# Patient Record
Sex: Female | Born: 1990 | Race: White | Hispanic: No | Marital: Married
Health system: Southern US, Community
[De-identification: ages and names within clinical notes are randomized; demographics above are authoritative.]

## PROBLEM LIST (undated history)

## (undated) DIAGNOSIS — R062 Wheezing: Secondary | ICD-10-CM

## (undated) DIAGNOSIS — L0291 Cutaneous abscess, unspecified: Secondary | ICD-10-CM

## (undated) DIAGNOSIS — K589 Irritable bowel syndrome without diarrhea: Secondary | ICD-10-CM

## (undated) DIAGNOSIS — R509 Fever, unspecified: Secondary | ICD-10-CM

## (undated) DIAGNOSIS — R059 Cough, unspecified: Secondary | ICD-10-CM

## (undated) DIAGNOSIS — R05 Cough: Secondary | ICD-10-CM

## (undated) DIAGNOSIS — M0800 Unspecified juvenile rheumatoid arthritis of unspecified site: Secondary | ICD-10-CM

## (undated) DIAGNOSIS — Z8614 Personal history of Methicillin resistant Staphylococcus aureus infection: Secondary | ICD-10-CM

## (undated) DIAGNOSIS — R140 Abdominal distension (gaseous): Secondary | ICD-10-CM

## (undated) DIAGNOSIS — G43909 Migraine, unspecified, not intractable, without status migrainosus: Secondary | ICD-10-CM

## (undated) DIAGNOSIS — K219 Gastro-esophageal reflux disease without esophagitis: Secondary | ICD-10-CM

## (undated) DIAGNOSIS — IMO0002 Reserved for concepts with insufficient information to code with codable children: Secondary | ICD-10-CM

## (undated) DIAGNOSIS — R5383 Other fatigue: Secondary | ICD-10-CM

## (undated) DIAGNOSIS — M329 Systemic lupus erythematosus, unspecified: Secondary | ICD-10-CM

## (undated) DIAGNOSIS — R197 Diarrhea, unspecified: Secondary | ICD-10-CM

## (undated) DIAGNOSIS — R634 Abnormal weight loss: Secondary | ICD-10-CM

## (undated) DIAGNOSIS — R531 Weakness: Secondary | ICD-10-CM

## (undated) DIAGNOSIS — J329 Chronic sinusitis, unspecified: Secondary | ICD-10-CM

## (undated) DIAGNOSIS — M797 Fibromyalgia: Secondary | ICD-10-CM

## (undated) DIAGNOSIS — F4541 Pain disorder exclusively related to psychological factors: Secondary | ICD-10-CM

## (undated) DIAGNOSIS — Z8669 Personal history of other diseases of the nervous system and sense organs: Secondary | ICD-10-CM

## (undated) HISTORY — DX: Migraine, unspecified, not intractable, without status migrainosus: G43.909

## (undated) HISTORY — DX: Diarrhea, unspecified: R19.7

## (undated) HISTORY — DX: Other fatigue: R53.83

## (undated) HISTORY — DX: Pain disorder exclusively related to psychological factors: F45.41

## (undated) HISTORY — PX: TONSILLECTOMY AND ADENOIDECTOMY: SHX28

## (undated) HISTORY — PX: TONSILLECTOMY: SUR1361

## (undated) HISTORY — DX: Unspecified juvenile rheumatoid arthritis of unspecified site: M08.00

## (undated) HISTORY — DX: Cough: R05

## (undated) HISTORY — PX: BREAST REDUCTION SURGERY: SHX8

## (undated) HISTORY — DX: Abnormal weight loss: R63.4

## (undated) HISTORY — DX: Weakness: R53.1

## (undated) HISTORY — DX: Wheezing: R06.2

## (undated) HISTORY — DX: Cutaneous abscess, unspecified: L02.91

## (undated) HISTORY — DX: Personal history of other diseases of the nervous system and sense organs: Z86.69

## (undated) HISTORY — DX: Irritable bowel syndrome, unspecified: K58.9

## (undated) HISTORY — DX: Chronic sinusitis, unspecified: J32.9

## (undated) HISTORY — DX: Fever, unspecified: R50.9

## (undated) HISTORY — PX: SMALL INTESTINE SURGERY: SHX150

## (undated) HISTORY — DX: Gastro-esophageal reflux disease without esophagitis: K21.9

## (undated) HISTORY — DX: Cough, unspecified: R05.9

## (undated) HISTORY — DX: Personal history of Methicillin resistant Staphylococcus aureus infection: Z86.14

## (undated) HISTORY — DX: Abdominal distension (gaseous): R14.0

## (undated) HISTORY — PX: HIP SURGERY: SHX245

## (undated) HISTORY — DX: Systemic lupus erythematosus, unspecified: M32.9

## (undated) HISTORY — DX: Fibromyalgia: M79.7

---
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
## 2010-05-10 ENCOUNTER — Ambulatory Visit (HOSPITAL_COMMUNITY): Admission: RE | Admit: 2010-05-10 | Discharge: 2010-05-10 | Payer: Self-pay | Admitting: Rheumatology

## 2010-06-27 ENCOUNTER — Encounter: Payer: Self-pay | Admitting: Emergency Medicine

## 2010-07-17 ENCOUNTER — Encounter: Payer: Self-pay | Admitting: Emergency Medicine

## 2010-07-24 ENCOUNTER — Encounter: Payer: Self-pay | Admitting: Emergency Medicine

## 2010-08-16 ENCOUNTER — Encounter: Payer: Self-pay | Admitting: Emergency Medicine

## 2010-08-21 ENCOUNTER — Encounter: Payer: Self-pay | Admitting: Emergency Medicine

## 2010-09-03 DIAGNOSIS — M329 Systemic lupus erythematosus, unspecified: Secondary | ICD-10-CM | POA: Insufficient documentation

## 2010-09-03 DIAGNOSIS — M083 Juvenile rheumatoid polyarthritis (seronegative): Secondary | ICD-10-CM | POA: Insufficient documentation

## 2010-09-03 DIAGNOSIS — IMO0001 Reserved for inherently not codable concepts without codable children: Secondary | ICD-10-CM | POA: Insufficient documentation

## 2010-09-04 ENCOUNTER — Ambulatory Visit: Payer: Self-pay | Admitting: Emergency Medicine

## 2010-09-04 DIAGNOSIS — R0602 Shortness of breath: Secondary | ICD-10-CM | POA: Insufficient documentation

## 2010-09-10 ENCOUNTER — Ambulatory Visit: Payer: Self-pay | Admitting: Emergency Medicine

## 2011-01-07 NOTE — Letter (Signed)
Summary: Pollyann Savoy MD  Pollyann Savoy MD   Imported By: [facility] 10/17/2010 08:22:20  _____________________________________________________________________  External Attachment:    Type:   Image     Comment:   External Document

## 2011-01-07 NOTE — Assessment & Plan Note (Signed)
Summary: dyspnea, SLE/RA   Visit Type:  Follow-up Copy to:  Dr. Corliss Skains Primary Provider/Referring Provider:  Dr.  Camillo Flaming  CC:  Dyspnea follow-up with PFT's....  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 Has been on MTX in past, also embrel, humera, orencia.   Referred by Dr Titus Dubin 09/04/10. Was treated for first time in July '11 with golimumab and at that time developed symptoms consistent with SLE. In retrospect she had a similar episode in the past with another antibody therapy. She had pred increased, added low dose plaquanil and steroid cream for her skin, rash improved but weakness, fatigue have worsened. Also developed dyspnea, feels like she can't take a deep breath, no wheeze, minimal cough, no sputum. 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  ROV 09/10/10 -- [place] returns today after CXR and PFT's performed for her progressive dyspnea. CXR without infiltrates as below.  She is clinically unchanged.   Current Medications (verified): 1)  Protonix 40 Mg Tbec (Pantoprazole Sodium) .... Take 1 Tablet By Mouth Once A Day 2)  Prednisone 5 Mg Tabs (Prednisone) .... Pt Currently On 15mg  Daily, Working Way Down To 10mg  3)  Arava 20 Mg Tabs (Leflunomide) .... Take 1 Tablet By Mouth Once A Day 4)  Loestrin 24 Fe 1-20 Mg-Mcg Tabs (Norethin Ace-Eth Estrad-Fe) .... Take 1 Tablet By Mouth Once A Day 5)  Vesicare 5 Mg Tabs (Solifenacin Succinate) .... Take 1 Tablet By Mouth Once A Day 6)  Atenolol 50 Mg Tabs (Atenolol) .... Take 1 Tablet By Mouth Once A Day 7)  Plaquenil 200 Mg Tabs (Hydroxychloroquine Sulfate) .Marland Kitchen.. 1 By Mouth Every Morning and 1/2 By Mouth Every Night 8)  Wellbutrin 75 Mg Tabs (Bupropion Hcl) .... Take 1 Tablet By Mouth Once A Day 9)  Steroid Cream .... Daily 10)  Ambien 10 Mg Tabs (Zolpidem Tartrate) .Marland Kitchen.. 1 By Mouth At Bedtime As Needed  Allergies (verified): No  Known Drug Allergies  Vital Signs:  Patient profile:   20 year old female Height:      62 inches Weight:      113 pounds O2 Sat:      97 % on Room air Temp:     98.4 degrees F oral Pulse rate:   99 / minute BP sitting:   118 / 80  (left arm) Cuff size:   regular  Vitals Entered By: Michel Bickers CMA (September 10, 2010 11:51 AM)  O2 Sat at Rest %:  97 O2 Flow:  Room air CC: Dyspnea follow-up with PFT's...   Physical Exam  General:  normal appearance and healthy appearing.   Head:  normocephalic and atraumatic Eyes:  conjunctiva and sclera clear Nose:  no deformity, discharge, inflammation, or lesions Mouth:  mild post pharyngeal erythema, no lesions Neck:  no masses, thyromegaly, or abnormal cervical nodes Lungs:  clear bilaterally to auscultation Heart:  Tachycardic, S3 audible at L heart border, S4 audible in sternal area, no M Abdomen:  not examined Msk:  normal Extremities:  no clubbing, cyanosis, edema, or deformity noted Neurologic:  non-focal Skin:  bruises on both knees, no malar rash (resolved) Psych:  alert and cooperative; normal mood and affect; normal attention span and concentration   X-ray  Procedure date:  09/04/2010  Findings:      Comparison: PA and lateral chest 05/10/2010.   Findings: Lungs clear.  Heart size normal.  No pleural effusion or focal bony abnormality.   IMPRESSION: Negative chest.   Read By:  Charyl Dancer,  M.D.     Released By:  Charyl Dancer,  M.D.  Pulmonary Function Test Date: 09/10/2010 Height (in.): 62 Gender: Female  Pre-Spirometry FVC    Value: 3.25 L/min   Pred: 3.19 L/min     % Pred: 102 % FEV1    Value: 2.99 L     Pred: 3.03 L     % Pred: 99 % FEV1/FVC  Value: 92 %     Pred: 86 %     % Pred: - % FEF 25-75  Value: 3.59 L/min   Pred: 3.42 L/min     % Pred: 105 %  Post-Spirometry FVC    Value: 3.19 L/min   Pred: 3.19 L/min     % Pred: 100 % FEV1    Value: 3.08 L     Pred: 3.03 L     % Pred: 99  % FEV1/FVC  Value: 97 %     Pred: 86 %     % Pred: - % FEF 25-75  Value: 4.21 L/min   Pred: 3.42 L/min     % Pred: 123 %  Lung Volumes TLC    Value: 4.49 L   % Pred: 108 % RV    Value: 1.24 L   % Pred: 141 % DLCO    Value: 19.2 %   % Pred: 89 %  Comments: Normal spirometry. ? suggestion of restriction based on high FEV1/FVC ration but this is not supported by lung volumes. Normal diffusion capacity. RSB   Impression & Recommendations:  Problem # 1:  DYSPNEA (ICD-786.05)  PFT and CXR are normal. No evidence to support PAH. I  believe that this is related to her overall weakness and fatigue. Unfortunately it appears that we will have to either d/c plaquanil or increase plaquanil to see if this is drug side effect vs weakness from lupus. She will discuss this with Dr Titus Dubin. If she continues to have problems and if th edx of SLE is confirmed then I will revisit PAH, possible TTE or R heart cath.   Orders: Est. Patient Level IV (72536)  Medications Added to Medication List This Visit: 1)  Plaquenil 200 Mg Tabs (Hydroxychloroquine sulfate) .Marland Kitchen.. 1 by mouth every morning and 1/2 by mouth every night 2)  Ambien 10 Mg Tabs (Zolpidem tartrate) .Marland Kitchen.. 1 by mouth at bedtime as needed  Patient Instructions: 1)  Your PFTs and CXR are normal.  2)  Please follow up with Dr Delton Coombes if your develop any worsening symptoms

## 2011-01-07 NOTE — Miscellaneous (Signed)
Summary: Orders Update pft charges  Clinical Lists Changes  Orders: Added new Service order of Carbon Monoxide diffusing w/capacity (94720) - Signed Added new Service order of Lung Volumes (94240) - Signed Added new Service order of Spirometry (Pre & Post) (94060) - Signed 

## 2011-01-07 NOTE — Letter (Signed)
Summary: Pollyann Savoy MD  Pollyann Savoy MD   Imported By: [facility] 10/17/2010 08:25:21  _____________________________________________________________________  External Attachment:    Type:   Image     Comment:   External Document

## 2011-01-07 NOTE — Assessment & Plan Note (Signed)
Summary: dyspnea, RA, SLE   Visit Type:  Initial Consult Copy to:  Dr. Corliss Skains Primary Provider/Referring Provider:  Dr.  Camillo Flaming  CC:  Pulmonary consult for SOB x 3 months started after a shot of Sympany. Pt has not taken shot since then.  SOB went away and but then came back once started Plaquenil. 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 Has been on MTX in past, also embrel, humera, orencia.   Referred by Dr Titus Dubin. Was treated for first time in July '11 with golimumab and at that time developed symptoms consistent with SLE. In retrospect she had a similar episode in the past with antibody therapy. She had pred increased, added low dose plaquanil, rash improved but weakness, fatigue have worsened. Also developed dyspnea, feels like she can't take a deep breath, no wheeze, minimal cough, no sputum. 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   Current Medications (verified): 1)  Protonix 40 Mg Tbec (Pantoprazole Sodium) .... Take 1 Tablet By Mouth Once A Day 2)  Prednisone 5 Mg Tabs (Prednisone) .... Pt Currently On 15mg  Daily, Working Way Down To 10mg  3)  Arava 20 Mg Tabs (Leflunomide) .... Take 1 Tablet By Mouth Once A Day 4)  Loestrin 24 Fe 1-20 Mg-Mcg Tabs (Norethin Ace-Eth Estrad-Fe) .... Take 1 Tablet By Mouth Once A Day 5)  Vesicare 5 Mg Tabs (Solifenacin Succinate) .... Take 1 Tablet By Mouth Once A Day 6)  Atenolol 50 Mg Tabs (Atenolol) .... Take 1 Tablet By Mouth Once A Day 7)  Plaquenil 200 Mg Tabs (Hydroxychloroquine Sulfate) .... Take 1 Tablet By Mouth Once A Day 8)  Wellbutrin 75 Mg Tabs (Bupropion Hcl) .... Take 1 Tablet By Mouth Once A Day 9)  Steroid Cream .... Daily  Allergies (verified): No Known Drug Allergies  Past History:  Past Medical History: Lupus, new diagnosis - drug-induced vs inherent Fibromyalgia Rheumatoid arthritis,  juvenile  Family History: Mother-asthma  Social History: Single Never smoker No ETOH Pt is a Consulting civil engineer at [employer]  Review of Systems       The patient complains of shortness of breath with activity, shortness of breath at rest, non-productive cough, acid heartburn, indigestion, difficulty swallowing, headaches, itching, hand/feet swelling, joint stiffness or pain, and rash.  The patient denies productive cough, coughing up blood, chest pain, irregular heartbeats, loss of appetite, weight change, abdominal pain, sore throat, tooth/dental problems, nasal congestion/difficulty breathing through nose, sneezing, ear ache, anxiety, depression, change in color of mucus, and fever.         Cough in the evening, non-productive. Allergies under good control.   Vital Signs:  Patient profile:   20 year old female Height:      61.5 inches Weight:      115.13 pounds BMI:     21.48 O2 Sat:      95 % on Room air Temp:     98.9 degrees F oral Pulse rate:   97 / minute BP sitting:   122 / 86  (right arm) Cuff size:   regular  Vitals Entered By: Carron Curie CMA (September 04, 2010 4:01 PM)  O2 Flow:  Room air CC: Pulmonary consult for SOB x 3 months started after a shot of Sympany. Pt has not taken shot since then.  SOB went away, but then came back once started Plaquenil.  Comments Medications reviewed with  patient Carron Curie CMA  September 04, 2010 4:12 PM Daytime phone number verified with patient.    Physical Exam  General:  normal appearance and healthy appearing.   Head:  normocephalic and atraumatic Eyes:  conjunctiva and sclera clear Nose:  no deformity, discharge, inflammation, or lesions Mouth:  mild post pharyngeal erythema, no lesions Neck:  no masses, thyromegaly, or abnormal cervical nodes Lungs:  clear bilaterally to auscultation Heart:  Tachycardic, S3 audible at L heart border, S4 audible in sternal area, no M Abdomen:  not examined Msk:   normal Extremities:  no clubbing, cyanosis, edema, or deformity noted Neurologic:  non-focal Skin:  bruises on both knees, no malar rash (resolved) Psych:  alert and cooperative; normal mood and affect; normal attention span and concentration   Impression & Recommendations:  Problem # 1:  DYSPNEA (ICD-786.05) Suspect that this is related to her overall fatigue and weakness that she has been experiencing since biologic therapy in July, then worse with addition of plaquanil for SLE. Agree that we need to rule out intrinsic pulmonary disease that would relate to RA, SLE, MTX-use, etc.  - CXR now and consider CT scan of the chest if any abnormality - full PFTs - rov to review after the above.   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 Pt currently on 15mg  daily, working way down to 10mg     Arava 20 Mg Tabs (Leflunomide) .Marland Kitchen... Take 1 tablet by mouth once a day    Plaquenil 200 Mg Tabs (Hydroxychloroquine sulfate) .Marland Kitchen... Take 1 tablet by mouth once a day  Medications Added to Medication List This Visit: 1)  Protonix 40 Mg Tbec (Pantoprazole sodium) .... Take 1 tablet by mouth once a day 2)  Prednisone 5 Mg Tabs (Prednisone) .... Pt currently on 15mg  daily, working way down to 10mg  3)  Arava 20 Mg Tabs (Leflunomide) .... Take 1 tablet by mouth once a day 4)  Loestrin 24 Fe 1-20 Mg-mcg Tabs (Norethin ace-eth estrad-fe) .... Take 1 tablet by mouth once a day 5)  Vesicare 5 Mg Tabs (Solifenacin succinate) .... Take 1 tablet by mouth once a day 6)  Atenolol 50 Mg Tabs (Atenolol) .... Take 1 tablet by mouth once a day 7)  Plaquenil 200 Mg Tabs (Hydroxychloroquine sulfate) .... Take 1 tablet by mouth once a day 8)  Wellbutrin 75 Mg Tabs (Bupropion hcl) .... Take 1 tablet by mouth once a day 9)  Steroid Cream  .... Daily  Other Orders: Consultation  Level IV (78295) T-2 View CXR (71020TC)  Patient Instructions: 1)  We will check full pulmonary function testing on Oct 4 at 11:00 at [facility].  2)  CXR today 3)  Follow up with Dr Delton Coombes on Oct 4 at 12:00pm   Immunization History:  Influenza Immunization History:    Influenza:  fluvax 3+ (08/08/2009)  Pneumovax Immunization History:    Pneumovax:  pneumovax (08/08/2008)

## 2011-04-16 ENCOUNTER — Other Ambulatory Visit (HOSPITAL_COMMUNITY): Payer: Self-pay | Admitting: Internal Medicine

## 2011-04-16 DIAGNOSIS — R51 Headache: Secondary | ICD-10-CM

## 2011-04-17 ENCOUNTER — Ambulatory Visit (HOSPITAL_COMMUNITY)
Admission: RE | Admit: 2011-04-17 | Discharge: 2011-04-17 | Disposition: A | Discharge status: Home / Self Care | Payer: 59 | Source: Ambulatory Visit | Attending: Internal Medicine | Admitting: Internal Medicine

## 2011-04-17 DIAGNOSIS — R51 Headache: Secondary | ICD-10-CM | POA: Insufficient documentation

## 2011-04-17 MED ORDER — GADOBENATE DIMEGLUMINE 529 MG/ML IV SOLN
10.0000 mL | Freq: Once | INTRAVENOUS | Status: AC
  Administered 2011-04-17: 10 mL via INTRAVENOUS

## 2011-04-22 NOTE — H&P (Signed)
Emily Reed, Emily Reed               ACCOUNT NO.:  [id number]   MEDICAL RECORD NO.:  [id number]          PATIENT TYPE:  INP   LOCATION:  0104                          FACILITY:  [facility]   PHYSICIAN:  Emily Reed, MDDATE OF BIRTH:  1991-05-27   DATE OF ADMISSION:  11/13/2007  DATE OF DISCHARGE:                       PSYCHIATRIC ADMISSION ASSESSMENT   IDENTIFICATION:  This 67-1/20-year-old female is admitted emergently  voluntarily on referral from Emily Reed who has provided therapy since  October 2008 for inpatient stabilization and treatment of suicide risk  and depression.  Patient talks to mother about depression and anxiety  but refuses to talk to family about self-mutilation and suicidal  ideation.  She has suicidal ideation to overdose including in the  bathtub with sleeping pills or to cut her wrists to die.   HISTORY OF PRESENT ILLNESS:  The patient has been in therapy with Emily Reed at [phone], finding that cutting that had been resolved for a  year has now resumed.  She had been cutting otherwise for 3 years for  dissipation of strong negative emotions and currently has healing  superficial lacerations on the left wrist with scars from previous  cutting elsewhere.  The patient will not talk or collaborate with family  to establish safety and improved management of dangerous symptoms.  With  the mother out of the room, she discusses visual illusions of seeing  people figures as well as hearing noises that appear to be more anxious  and associated with re-emergence of repressed and suppressed strong  negative emotions.  The patient is not yet successfully transforming her  ability to cope with chronic medical illness and life changes.  She  still mourns the move from [place] to [place], finishing the  10th grade at [name], having been prior to that at [employer] in [place].  She had to move because of father's job,  leaving older brother and  sister in college in [place].  Approximately  1 year ago, the patient was evaluated and treated at [facility] for medical symptoms included to be juvenile  rheumatoid arthritis and in addition irritable bowel syndrome and  eosinophilic esophagitis.  The patient requires methotrexate  intramuscular weekly and Humira intramuscular every [redacted] weeks along with  her Celebrex.  She has taken Elavil for sleep in the past and is now  taking melatonin.  The patient reports being depressed since the 7th  grade, worse since moving to [place] from [place] last year.  She has been cutting for 3 years, though she stopped for a year until  resuming more recently.  The patient describes significant anxiety and  depression.  She has gained 20 pounds in the last year, finding herself  eating to relieve stress when not cutting.  She has perfectionistic  features, fidgeting, and retentiveness that appear to be associated with  anxiety as well.  She has had difficulty with sleep, has had increased  eating associated with depression as well.  She has been sleeping 12  hours nightly the last week, apparently using her sleeping  pills.  She  stays in her pajamas much of the time and notes she has been more  anxious the last week.  The patient is not stabilizing the symptoms with  support of family or outpatient providers.  The JRA apparently limits  her physical activity which may in the end make coping with stress and  maintaining positive mood more difficult.  She has used no alcohol or  illicit drugs.   PAST MEDICAL HISTORY:  The patient reports receiving primary care from  Dr. Chales Reed as well as Dr. Alphonzo Reed at [facility].  She was seen  in the emergency department in May 2008 with constipation and abdominal  pain.  She had a negative nuclear medicine bone scan in December 2007 at  [facility] noting increased activity around the joints  considered at that  time most consistent with skeletal growth.  All other  testing and treatment have apparently been done at [facility].  The patient  did have tonsillectomy and adenoidectomy.  She has eyeglasses.  She  abstains from ingesting pork.  She abstains from ibuprofen because of  Celebrex.  Her last gynecological exam was in October 2008 and she is on  Loestrin every morning for regulation of her cycle, with last menses  November 09, 2007.  Her last medical appointment was November 2008.   She takes Protonix 40 mg every morning, Xyzal 5 mg every bedtime,  Celebrex 200 mg b.i.d., melatonin 3 mg at bedtime, ALLERGY SHOTS,  methotrexate 25 mg intramuscular every week, Humira intramuscular every  2 weeks and Tylenol Arthritis.  SHE REPORTS ALLERGY TO SESAME.  She  abstains from ibuprofen because of Celebrex.  She denies seizure or  syncope.  She has no heart murmur or arrhythmia.   REVIEW OF SYSTEMS:  The patient denies difficulty with gait, gaze or  continence.  She denies exposure to communicable disease or toxins.  She  denies rash, jaundice, or purpura.  She has no headache or memory loss  currently.  There is no sensory loss or coordination deficit.  There is  no cough, congestion, dyspnea, tachypnea or wheeze.  There is no  abdominal pain, nausea, vomiting or diarrhea.  There is no dysuria, but  she does have arthralgia and limits activity.  There is no chest pain,  palpitations or presyncope.   FAMILY HISTORY:  The patient lived in [place] for 10 years before  moving to [place] last year for father's employment.  Two older  siblings remain in [place] at college.  The patient lives with mother  and father.  They do not acknowledge any pertinent family history for  major psychiatric disorders at this time but family history remains to  be fully developed as the patient tolerates such.   SOCIAL AND DEVELOPMENTAL HISTORY:  The patient is an 11th grade student  at [name],  noting that grades have been diminished  recently.  She reports being at [employer] in the 10th grade  but 9th grade was at [employer] in [place].  The patient does  not acknowledge sexual activity.  She does not use alcohol or illicit  drugs.  She has no legal charges.   ASSETS:  The patient is cooperative and respectful except for her  treatment of herself.   MENTAL STATUS EXAM:  Height is 155 cm.  Weight is 58 kg up from 52.7 kg  in May 2008.  Blood pressure 117/86 with heart rate of 88 sitting  and  124/92 with heart rate of 100 standing.  She is right-handed.  She is  alert and oriented with speech intact.  Cranial nerves II-XII are  intact.  Muscle strength and tone are normal.  There are no pathologic  reflexes or soft neurologic findings.  There are no abnormal involuntary  movements.  Gait and gaze are intact.  She is relatively short in  stature.  She has alexithymic interpersonal posture, storing up and  repressing strong negative emotion including despair, anger and anxiety.  She has spontaneous re-emergence of cumulative anxiety and despair that  contribute to self-injury, though anger is not dissipated externally  except for how it is channeled into self-cutting with anxiety and  depression.  The patient is now suicidal with thoughts of overdosing,  especially with sleeping pills in the bathtub or cutting her wrists to  die.  The patient does not open up with family about her symptoms.  She  is the youngest child with hysteroid displacement and denial.  She does  not manifest overt psychosis.  Her misperceptions seem to be associated  with displacement and relative dissociation in attempting to cope with  chronic medical illness and its consequences as well as anxiety and  despair;  she hears noises and sees people figures.  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  Axis II:  Diagnosis deferred.  Axis III:  Juvenile rheumatoid arthritis; lacerations, left wrist;  relative short stature; irritable bowel syndrome; eosinophilic  esophagitis; birth control pill for irregular menses; eyeglasses;  ALLERGY TO SESAME and abstains from pork and ibuprofen.  Axis IV:  Stressors,  medical severe, acute and chronic; phase of life  severe, acute and chronic; family mild, acute and chronic.  Axis V:  Global Assessment of Functioning on admission is 35 with  highest in last year 64.   PLAN:  The patient is admitted for inpatient adolescent psychiatric and  multidisciplinary multimodal behavioral treatment in a team-based  programmatic locked psychiatric unit.  Will consider Zoloft  pharmacotherapy, though melatonin appears to be working in the last week  for sleep unless hypersomnia has become a depressed symptom.  Mother  would prefer Valium 5 mg prn for the patient and takes it for phobia of  air travel herself.  Cognitive behavioral therapy, anger management,  interpersonal therapy, desensitization, habit reversal, reintegration,  identity consolidation, family therapy, coping with chronic medical  illness, social and communication skill training and problem-solving and  coping skill training and therapies can be undertaken.  Estimated length  of stay is 5-6 days with target symptoms for discharge being  stabilization of suicide risk and mood, stabilization of anxiety and  misperceptions, and generalization of the capacity for safe effective  participation in outpatient treatment.      Emily Brothers, MD  Electronically Signed     GEJ/MEDQ  D:  11/14/2007  T:  11/15/2007  Job:  [id number]

## 2011-04-25 NOTE — Discharge Summary (Signed)
Emily Reed, Emily Reed               ACCOUNT NO.:  [id number]   MEDICAL RECORD NO.:  [id number]          PATIENT TYPE:  INP   LOCATION:  0104                          FACILITY:  [facility]   PHYSICIAN:  [name], MDDATE OF BIRTH:  November 10, 1991   DATE OF ADMISSION:  11/13/2007  DATE OF DISCHARGE:  11/19/2007                               DISCHARGE SUMMARY   IDENTIFICATION:  A 44-1/20-year-old female eleventh grade student at  [employer] was admitted emergently voluntarily on referral  from her therapist, [name], who has provided treatment since October  2008 for inpatient stabilization and treatment of suicide risk and  depression.  The patient only talks to mother about depression and  anxiety, refusing to talk about self mutilation and her suicide ideation  which includes plans to drown herself in the bathtub while taking  sleeping pills or cutting her wrist.  Three years of self cutting has  been stopped for the last year until now resumed.  For full details,  please see the typed admission assessment.   SYNOPSIS OF PRESENT ILLNESS:  The patient resides with mother and father  harboring dissatisfaction and anger for father's job moving them from  [place], [place], to [place] last year.  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  She has been treated with Elavil for  sleep, now changed to melatonin.  Sleeping 12 hours nightly the last  week.  Parents do not acknowledge the patient's anger with them, and the  patient becomes more angry and depressed.  Mother gradually discloses  that the patient's older siblings had similar symptoms in the past  though they are now in [place]  in college at ages 66 and 53.  Older  siblings responded favorably to Klonopin and Lexapro for anxiety which  mother also manifests.  The mother initially wants the patient to have  Valium like herself which mother takes for fear of flying.  Maternal  aunt had borderline personality disorder and paternal uncle is  recovering from substance abuse with alcohol.  There is family history  of cancer, Crohn's disease, diabetes, hypertension, and stroke.  The  patient has gained 20 pounds from binge eating and self treatment of  anxiety.  The patient is taking at the time of admission Celebrex 200 mg  b.i.d., Protonix 40 mg every morning, Xyzal 5 mg every morning,  melatonin 3 mg at bedtime, HST allergy shots, methotrexate 25 mg  intramuscular weekly, Humira intramuscular q.2 weeks, and Tylenol  Arthritis.  She abstains from ibuprofen and reports allergy to sesame.  She has had tonsillectomy in the past.   INITIAL MENTAL STATUS EXAM:  The patient presents with alexithymic  defenses storing up and repressing strong negative emotion including  despair, anger, and anxiety.  Maladaptive dissipation through self  cutting and academic underachievement seemed  only to intensify anxiety  and depression.  She is suicidal with thoughts of overdosing or cutting  her wrist to die in the bathtub.  She manifests displacement and  relative dissociation in her defenses reporting that she hears noises  and sees people figures as possible misperceptions.  She is also on  Loestrin every morning.  Nuclear medicine bone scan in December 2007 at  [facility] noted increased activity around the joints  considered at that time to be most consistent with skeletal growth.   LABORATORY FINDINGS:  CBC was normal except the eosinophil count was low  at 100 with reference range [phone] on absolute quantitation.  Total  white count was normal at 5500, hemoglobin 13.7, MCV at 87.4, and  platelet count 314,000.  Serum  HCG was negative.  Urinalysis was normal  with specific gravity of 1.016 and pH 7.5.  Comprehensive metabolic  panel was normal with sodium 140, potassium 3.7, random glucose 106,  creatinine 0.69, calcium 9.5, albumin 3.8, AST 22, and ALT 29.  TSH is  normal at 1.362 with reference range 0.35-5.5.  Urine drug screen was  negative with creatinine of 104 mg/dL.   HOSPITAL COURSE AND TREATMENT:  General medical exam by Mallie Darting, PA-  C raised differential diagnosis of past precocious puberty reporting  menarche at age 17, however.  However, they reported that she may have  taken Lupron for precocious puberty in the past.  Exam was otherwise  normal at this time, and she reports no current symptoms from JRA which  she expects to enter remission at some point in the near future, though  she has been having headache recently.  She was afebrile throughout  hospital stay with maximum temperature 98.4 on admission.  Height on  admission was 155 cm, and weight was 58 kg having been 52.7 kg in May  2008.  Discharge weight was 55.5 kg.  Initial supine blood pressure was  110/77 with heart rate of 108 and standing blood pressure 126/77 with  heart rate of 100.  Subsequent supine blood pressure was 117/77 with  heart rate of 110 and standing blood pressure 105/61 with heart rate of  121.  On the day of discharge, supine blood pressure was 90/46 with  heart rate of 87.  The patient did receive her methotrexate injection IM  on November 14, 2007, giving it herself.  She did not require her Humira  during her hospital stay.  By the end of the second hospital day, mother  and patient had disengaged from mother's request for Valium for the  patient and requested that Lexapro and Klonopin be established for the  patient similar to what worked for siblings in the past.  The patient  was established on a fixed dose of 10 mg of Lexapro every bedtime and  Klonopin 0.5 mg morning and bedtime.  The patient  tolerated the  medication well, complaining once that she started to have a panic  attack in group therapy but was able to abort the panic by using the  coping skills being taught in the treatment program.  Still she and  mother were dissatisfied that the patient would have even an aborted  panic attack during the hospital stay.  The mother was dissatisfied at  the time of discharge that the patient was happy until parents arrived  for discharge and then became irritable and dysphoric.  The patient  disclosed to parents at that time that she was irritable and  dysphoric  because of them.  The patient would clarify that she left her heart in  [place] and did not have a life in [place].  During the hospital  stay, she developed a life in [place] with peers and program.  She became able to concentrate on homework and to start catching up on  homework.  She became able to value relationships and to have a life.  She had hope that her JRA would enter remission.  She concluded by the  end of the hospital stay that she wanted to be a therapist herself in  the future rather than attending med school.  In the final family  therapy session including both parents, the patient indicated that she  had learned that her mother is her biggest source of her problems.  Mother was defensive and interrupted the patient in a sarcastic fashion.  The patient reported that mother never listens to her or gives her a  chance to talk.  Mother clarified that she is tired of the patient being  so needy.  The patient acknowledged that she had a difficult time  determining whether psychological factors were crucial to her medical  problems or vice versa or both.  Family therapy appears essential, and  the patient reported that mother calling her fat was very hurtful.  The  family was able to reconcile the need for ongoing family therapy with  the patient's progress during the hospital stay.  The patient  required  no seclusion or restraint during the hospital stay.  She is willing to  return to school though she devalues such when mother is present.   FINAL DIAGNOSES:  AXIS I:  (1) Dysthymic disorder, early onset, severe  with atypical features.  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  (3) Psychological factors affecting physical  condition of juvenile rheumatoid arthritis.  (4) Parent child problem.  (5) Other specified family circumstances.  AXIS II:  Diagnosis deferred.  AXIS III:  (1) Juvenile rheumatoid arthritis.  (2) Self-inflicted  lacerations left wrist.  (3) Relative short stature.  (4) Irritable  bowel syndrome by history.  (5) Eosinophilic esophagitis by history.  (6) Birth control pill for irregular menses.  (7) Eyeglasses.  (8)  Allergy to sesame.  (9)  Abstains from pork and ibuprofen.  AXIS IV:  Stressors family severe acute and chronic; phase of life  severe acute and chronic; medical severe acute and chronic; school  moderate acute and chronic.  AXIS V:  GAF on admission 35 with highs in the last year estimated at 64  and discharge GAF was 54.   PLAN:  The patient was discharged to mother and father in improved  condition free of suicide ideation or homicide ideation.  She follows  her weight control diet and has no restriction on physical activity  other than that required by her rheumatoid arthritis if any.  Her left  wrist wounds are healing and need only protection from further injury,  sunlight, or drying to reduce scarring.  She requires no other pain  management.  They declined to have discharge summary sent to [facility]  where JRA care is received.   She is discharged on the following medication:   1. Klonopin 0.5 mg every morning and bedtime quantity #60 with no      refill.  2. Lexapro 10 mg every bedtime quantity #30 with no refill prescribed.  3. Protonix 40 mg every morning own home supply.  4. Loestrin  every morning own  home supply.  5. Celebrex 200 mg morning and evening own home supply.  6. Xyzal 5 mg every bedtime own home supply.  7. Tylenol Arthritis as directed with own home supply over-the-      counter.  8. Methotrexate 1 mL intramuscular weekly with last injection November 14, 2007.  9. Humira intramuscular every other week with next injection due next      week.  10.Her melatonin is discontinued at this time.   She will see [name] November 24, 2007, at 1603 at [phone] for  therapy.  They will see Jorje Guild, PA, December 26, 2006, at 1530 at [phone]  [phone].      [name], MD  Electronically Signed     GEJ/MEDQ  D:  11/23/2007  T:  11/24/2007  Job:  [id number]   cc:   [name]  [address] [place], [place] [address]   Yolande Jolly, PA  [address]  [place], [place] [address]

## 2011-06-17 ENCOUNTER — Other Ambulatory Visit: Payer: Self-pay | Admitting: Gastroenterology

## 2011-06-17 DIAGNOSIS — R1110 Vomiting, unspecified: Secondary | ICD-10-CM

## 2011-06-17 DIAGNOSIS — R197 Diarrhea, unspecified: Secondary | ICD-10-CM

## 2011-06-23 ENCOUNTER — Ambulatory Visit
Admission: RE | Admit: 2011-06-23 | Discharge: 2011-06-23 | Disposition: A | Discharge status: Home / Self Care | Payer: 59 | Source: Ambulatory Visit | Attending: Gastroenterology | Admitting: Gastroenterology

## 2011-06-23 ENCOUNTER — Other Ambulatory Visit: Payer: Self-pay | Admitting: Gastroenterology

## 2011-06-23 DIAGNOSIS — R1110 Vomiting, unspecified: Secondary | ICD-10-CM

## 2011-06-23 DIAGNOSIS — R197 Diarrhea, unspecified: Secondary | ICD-10-CM

## 2011-07-01 ENCOUNTER — Other Ambulatory Visit: Payer: Self-pay | Admitting: Gastroenterology

## 2011-07-01 DIAGNOSIS — R112 Nausea with vomiting, unspecified: Secondary | ICD-10-CM

## 2011-07-02 ENCOUNTER — Ambulatory Visit
Admission: RE | Admit: 2011-07-02 | Discharge: 2011-07-02 | Disposition: A | Discharge status: Home / Self Care | Payer: 59 | Source: Ambulatory Visit | Attending: Gastroenterology | Admitting: Gastroenterology

## 2011-07-02 DIAGNOSIS — R112 Nausea with vomiting, unspecified: Secondary | ICD-10-CM

## 2011-07-07 ENCOUNTER — Other Ambulatory Visit: Payer: Self-pay | Admitting: Gastroenterology

## 2011-07-07 DIAGNOSIS — R197 Diarrhea, unspecified: Secondary | ICD-10-CM

## 2011-07-09 ENCOUNTER — Other Ambulatory Visit: Payer: 59

## 2011-08-13 DIAGNOSIS — R197 Diarrhea, unspecified: Secondary | ICD-10-CM

## 2011-08-13 DIAGNOSIS — B349 Viral infection, unspecified: Secondary | ICD-10-CM | POA: Insufficient documentation

## 2011-08-13 DIAGNOSIS — J329 Chronic sinusitis, unspecified: Secondary | ICD-10-CM

## 2011-08-13 HISTORY — DX: Chronic sinusitis, unspecified: J32.9

## 2011-08-13 HISTORY — DX: Diarrhea, unspecified: R19.7

## 2011-08-29 ENCOUNTER — Emergency Department (HOSPITAL_COMMUNITY): Payer: 59

## 2011-08-29 ENCOUNTER — Emergency Department (HOSPITAL_COMMUNITY)
Admission: EM | Admit: 2011-08-29 | Discharge: 2011-08-30 | Disposition: A | Discharge status: Home / Self Care | Payer: 59 | Attending: Emergency Medicine | Admitting: Emergency Medicine

## 2011-08-29 DIAGNOSIS — R509 Fever, unspecified: Secondary | ICD-10-CM | POA: Insufficient documentation

## 2011-08-29 DIAGNOSIS — R112 Nausea with vomiting, unspecified: Secondary | ICD-10-CM | POA: Insufficient documentation

## 2011-08-29 DIAGNOSIS — B349 Viral infection, unspecified: Secondary | ICD-10-CM

## 2011-08-29 DIAGNOSIS — K589 Irritable bowel syndrome without diarrhea: Secondary | ICD-10-CM | POA: Insufficient documentation

## 2011-08-29 DIAGNOSIS — R042 Hemoptysis: Secondary | ICD-10-CM | POA: Insufficient documentation

## 2011-08-29 DIAGNOSIS — R079 Chest pain, unspecified: Secondary | ICD-10-CM | POA: Insufficient documentation

## 2011-08-29 DIAGNOSIS — M083 Juvenile rheumatoid polyarthritis (seronegative): Secondary | ICD-10-CM | POA: Insufficient documentation

## 2011-08-29 DIAGNOSIS — Z79899 Other long term (current) drug therapy: Secondary | ICD-10-CM | POA: Insufficient documentation

## 2011-08-29 DIAGNOSIS — R109 Unspecified abdominal pain: Secondary | ICD-10-CM | POA: Insufficient documentation

## 2011-08-29 LAB — DIFFERENTIAL
Basophils Absolute: 0 10*3/uL (ref 0.0–0.1)
Basophils Relative: 0 % (ref 0–1)
Eosinophils Absolute: 0 10*3/uL (ref 0.0–0.7)
Eosinophils Relative: 0 % (ref 0–5)
Lymphocytes Relative: 13 % (ref 12–46)
Lymphs Abs: 0.6 10*3/uL — ABNORMAL LOW (ref 0.7–4.0)
Monocytes Absolute: 0.1 10*3/uL (ref 0.1–1.0)
Monocytes Relative: 2 % — ABNORMAL LOW (ref 3–12)
Neutro Abs: 3.6 10*3/uL (ref 1.7–7.7)
Neutrophils Relative %: 85 % — ABNORMAL HIGH (ref 43–77)

## 2011-08-29 LAB — URINALYSIS, ROUTINE W REFLEX MICROSCOPIC
Bilirubin Urine: NEGATIVE
Bilirubin Urine: NEGATIVE
Glucose, UA: NEGATIVE mg/dL
Glucose, UA: NEGATIVE mg/dL
Hgb urine dipstick: NEGATIVE
Hgb urine dipstick: NEGATIVE
Ketones, ur: 15 mg/dL — AB
Ketones, ur: NEGATIVE mg/dL
Leukocytes, UA: NEGATIVE
Leukocytes, UA: NEGATIVE
Nitrite: NEGATIVE
Nitrite: NEGATIVE
Protein, ur: NEGATIVE mg/dL
Protein, ur: 30 mg/dL — AB
Specific Gravity, Urine: 1.01 (ref 1.005–1.030)
Specific Gravity, Urine: 1.014 (ref 1.005–1.030)
Urobilinogen, UA: 0.2 mg/dL (ref 0.0–1.0)
Urobilinogen, UA: 0.2 mg/dL (ref 0.0–1.0)
pH: 6.5 (ref 5.0–8.0)
pH: 8.5 — ABNORMAL HIGH (ref 5.0–8.0)

## 2011-08-29 LAB — COMPREHENSIVE METABOLIC PANEL
ALT: 33 U/L (ref 0–35)
AST: 16 U/L (ref 0–37)
Albumin: 4.5 g/dL (ref 3.5–5.2)
Alkaline Phosphatase: 38 U/L — ABNORMAL LOW (ref 39–117)
BUN: 10 mg/dL (ref 6–23)
CO2: 25 mEq/L (ref 19–32)
Calcium: 9.9 mg/dL (ref 8.4–10.5)
Chloride: 105 mEq/L (ref 96–112)
Creatinine, Ser: 0.6 mg/dL (ref 0.50–1.10)
GFR calc Af Amer: >60 mL/min (ref >60)
GFR calc non Af Amer: >60 mL/min (ref >60)
Glucose, Bld: 115 mg/dL — ABNORMAL HIGH (ref 70–99)
Potassium: 3.6 mEq/L (ref 3.5–5.1)
Sodium: 142 mEq/L (ref 135–145)
Total Bilirubin: 0.4 mg/dL (ref 0.3–1.2)
Total Protein: 7.4 g/dL (ref 6.0–8.3)

## 2011-08-29 LAB — URINE MICROSCOPIC-ADD ON

## 2011-08-29 LAB — POCT PREGNANCY, URINE: Preg Test, Ur: NEGATIVE

## 2011-08-29 LAB — LIPASE, BLOOD: Lipase: 30 U/L (ref 11–59)

## 2011-08-29 LAB — CBC
HCT: 43.3 % (ref 36.0–46.0)
Hemoglobin: 15 g/dL (ref 12.0–15.0)
MCH: 28.9 pg (ref 26.0–34.0)
MCHC: 34.6 g/dL (ref 30.0–36.0)
MCV: 83.4 fL (ref 78.0–100.0)
Platelets: 228 10*3/uL (ref 150–400)
RBC: 5.19 MIL/uL — ABNORMAL HIGH (ref 3.87–5.11)
RDW: 13.5 % (ref 11.5–15.5)
WBC: 4.5 10*3/uL (ref 4.0–10.5)

## 2011-08-29 LAB — LACTIC ACID, PLASMA: Lactic Acid, Venous: 1.1 mmol/L (ref 0.5–2.2)

## 2011-08-29 LAB — GLUCOSE, CAPILLARY: Glucose-Capillary: 114 mg/dL — ABNORMAL HIGH (ref 70–99)

## 2011-08-29 LAB — D-DIMER, QUANTITATIVE: D-Dimer, Quant: 0.75 ug/mL-FEU — ABNORMAL HIGH (ref 0.00–0.48)

## 2011-08-29 MED ORDER — IOHEXOL 300 MG/ML  SOLN: 100.0000 mL | Freq: Once | INTRAMUSCULAR | Status: AC | PRN

## 2011-08-30 MED ORDER — IOHEXOL 300 MG/ML  SOLN
100.0000 mL | Freq: Once | INTRAMUSCULAR | Status: AC | PRN
  Administered 2011-08-30: 100 mL via INTRAVENOUS

## 2011-09-01 ENCOUNTER — Ambulatory Visit (INDEPENDENT_AMBULATORY_CARE_PROVIDER_SITE_OTHER): Payer: 59 | Admitting: Infectious Disease

## 2011-09-01 ENCOUNTER — Ambulatory Visit
Admission: RE | Admit: 2011-09-01 | Discharge: 2011-09-01 | Disposition: A | Discharge status: Home / Self Care | Payer: 59 | Source: Ambulatory Visit | Attending: Infectious Disease | Admitting: Infectious Disease

## 2011-09-01 ENCOUNTER — Encounter: Payer: Self-pay | Admitting: Infectious Disease

## 2011-09-01 DIAGNOSIS — N61 Mastitis without abscess: Secondary | ICD-10-CM

## 2011-09-01 DIAGNOSIS — M083 Juvenile rheumatoid polyarthritis (seronegative): Secondary | ICD-10-CM

## 2011-09-01 DIAGNOSIS — R112 Nausea with vomiting, unspecified: Secondary | ICD-10-CM

## 2011-09-01 DIAGNOSIS — R509 Fever, unspecified: Secondary | ICD-10-CM

## 2011-09-01 DIAGNOSIS — A4902 Methicillin resistant Staphylococcus aureus infection, unspecified site: Secondary | ICD-10-CM

## 2011-09-01 DIAGNOSIS — N611 Abscess of the breast and nipple: Secondary | ICD-10-CM

## 2011-09-01 NOTE — Assessment & Plan Note (Signed)
This was more of an intense purulent cellulitis. Will check US of the breat and refer to [facility] surgeon. THe pt and her mother want someone to cut on her breast to remove the infection> I expained that I advised against his at present but that I could refer her to surgeon to establish a relatioship for future possible surgeries (certainly if the US shows abscess IWOULD immediately want help from surgeon)

## 2011-09-01 NOTE — Progress Notes (Signed)
Subjective:    Patient ID: Emily Reed, female    DOB: 1990-12-25, 20 y.o.   MRN: [id number]  HPI 20 year old Caucasian female with VERY complicated medical history of juvenile rheumatoid arthritis, fibromyalgia, and apparent drug induced lupus who has been followed by Denyce Robert at [facility], Dolores Hoose at [facility] for these rheumatological issues. She also has IBS, and has eosinophilic esophagitis and is followed closely by Dr. Randa Evens with [facility]. She has had onsent this May of severe nausea with vomiting and difficulty keeping foods down despite multiple medical therapies and even adjunctive anxiolytics and pt having taken THC. These began this past May along with low grade fevers. She has had an extensive workup for the GI complaints performed by Dr. Randa Evens including stool for ova and parasites, culture, giardia, cryptosporidium, also an EGD and colonoscopy done which were completely unrevealing on exam and on multiple biopsies. She also has history of breast reduction surgery several years ago and had a flare of a purulent cellulitis of the right breast this August when she was hospitalized at [facility]. They did [place] of her breast which failed to show any abscess. They gave her IV vancomycin to which she had red mans syndrome and she was changed ultimately to IV clindamycin and then oral clindamycin. She was dc on this and her breast tissue became less inflamed and was improving and pigment lightening. She then went off of the clindamycin and the skin pigment became darker. In the interim her gastrointestinal symptoms actually improved. She was seen by her PCP for c/o sinus pressure, facial pain and cough with brown phlegm and pt was given oral bactrim to treat this and the breast tissue. She felt better still but then had recurrence of her GI symptoms of nausea, vomiting and malaise. She states that she is in the middle of a "lupus flare" and that she is on prednisone for this. 20 diffuse arthrtiic pains  are currently improved on the prednisone therapy. She was seen in the ED on the 21st due to the above mentioned symptoms. They did CT chest abdomen and pelvis which were all completely normal, as was her blood work and her blood cultures. She is referred here for workup of her subjective fevers and low grade objective fevers, malaise ,nausea vomiting andher history of recent MRSA celllultis. I spent over an hour with this patient including greater than 50% of time counselling the pt and the mother and in coordinating care including US of the breast to be done today and referral per pt request to [facility].   Review of Systems  Constitutional: Negative for fever, chills, diaphoresis, activity change, appetite change, fatigue and unexpected weight change.  HENT: Negative for congestion, sore throat, rhinorrhea, sneezing, trouble swallowing and sinus pressure.   Eyes: Negative for photophobia and visual disturbance.  Respiratory: Negative for cough, chest tightness, shortness of breath, wheezing and stridor.   Cardiovascular: Negative for chest pain, palpitations and leg swelling.  Gastrointestinal: Positive for nausea, vomiting and abdominal distention. Negative for abdominal pain, diarrhea, constipation, blood in stool and anal bleeding.  Genitourinary: Negative for dysuria, hematuria, flank pain and difficulty urinating.  Musculoskeletal: Positive for arthralgias. Negative for myalgias, back pain, joint swelling and gait problem.  Skin: Negative for color change, pallor, rash and wound.  Neurological: Negative for dizziness, tremors, weakness and light-headedness.  Hematological: Negative for adenopathy. Does not bruise/bleed easily.  Psychiatric/Behavioral: Negative for behavioral problems, confusion, sleep disturbance, dysphoric mood, decreased concentration and agitation.  Objective:   Physical Exam  Constitutional: She is oriented to person, place, and time. She appears well-developed and  well-nourished. No distress.  HENT:  Head: Normocephalic and atraumatic.  Mouth/Throat: Oropharynx is clear and moist. No oropharyngeal exudate.  Eyes: Conjunctivae and EOM are normal. Pupils are equal, round, and reactive to light. No scleral icterus.  Neck: Normal range of motion. Neck supple. No JVD present.  Cardiovascular: Normal rate, regular rhythm and normal heart sounds.  Exam reveals no gallop and no friction rub.   No murmur heard. Pulmonary/Chest: Effort normal and breath sounds normal. No respiratory distress. She has no wheezes. She has no rales. She exhibits no tenderness.  Abdominal: She exhibits no distension and no mass. There is no tenderness. There is no rebound and no guarding.  Musculoskeletal: She exhibits no edema and no tenderness.  Lymphadenopathy:    She has no cervical adenopathy.  Neurological: She is alert and oriented to person, place, and time. She has normal reflexes. She exhibits normal muscle tone. Coordination normal.  Skin: Skin is warm and dry. No abrasion, no bruising, no burn, no ecchymosis, no laceration, no lesion and no rash noted. She is not diaphoretic. There is erythema. No pallor.          Skin is hyperpigmented but not terribly erythematous. THere is absolutely no discharge an no fluctuance or warmth  Psychiatric: She has a normal mood and affect. Her behavior is normal. Judgment and thought content normal.          Assessment & Plan:  Fever Fevers could certainly be due to her Connective tissue disease. I see no evidence of severe MRSA infection of skin or soft tissue at present nor of bacteremia or other severe infection that would explain these symptoms. I will check US of the breast (see below). I will check HIV ab, rna, hepatitis panel, EBV, CMV serologies, CPK, ESR, ACE level. I will have her followup in 2 weeks time here  MRSA (methicillin resistant Staphylococcus aureus) infection I would do a decolonization regimen in  future  Breast abscess This was more of an intense purulent cellulitis. Will check US of the breat and refer to [facility] surgeon. THe pt and her mother want someone to cut on her breast to remove the infection> I expained that I advised against his at present but that I could refer her to surgeon to establish a relatioship for future possible surgeries (certainly if the US shows abscess IWOULD immediately want help from surgeon)  Nausea & vomiting I really have no idea why she has this issue at present. I do not know why this improved with clindamycin or bactrim other than placebo effect. I will check an H pylori antibody. I will defer back to GI with regards to further repeat testing such as repeat EGD etc.  ARTHRITIS, RHEUMATOID, JUVENILE Being followed by Dr Lynda Rainwater

## 2011-09-01 NOTE — Assessment & Plan Note (Signed)
I would do a decolonization regimen in future

## 2011-09-01 NOTE — Assessment & Plan Note (Signed)
Being followed by Dr Lynda Rainwater

## 2011-09-01 NOTE — Assessment & Plan Note (Signed)
Fevers could certainly be due to her Connective tissue disease. I see no evidence of severe MRSA infection of skin or soft tissue at present nor of bacteremia or other severe infection that would explain these symptoms. I will check US of the breast (see below). I will check HIV ab, rna, hepatitis panel, EBV, CMV serologies, CPK, ESR, ACE level. I will have her followup in 2 weeks time here

## 2011-09-01 NOTE — Assessment & Plan Note (Signed)
I really have no idea why she has this issue at present. I do not know why this improved with clindamycin or bactrim other than placebo effect. I will check an H pylori antibody. I will defer back to GI with regards to further repeat testing such as repeat EGD etc.

## 2011-09-02 ENCOUNTER — Telehealth: Payer: Self-pay | Admitting: *Deleted

## 2011-09-02 LAB — HEPATITIS PANEL, ACUTE
HCV Ab: NEGATIVE
Hep A IgM: NEGATIVE
Hep B C IgM: NEGATIVE
Hepatitis B Surface Ag: NEGATIVE

## 2011-09-02 LAB — COMPLETE METABOLIC PANEL WITH GFR
ALT: 21 U/L (ref 0–35)
AST: 14 U/L (ref 0–37)
Albumin: 4.6 g/dL (ref 3.5–5.2)
Alkaline Phosphatase: 33 U/L — ABNORMAL LOW (ref 39–117)
BUN: 11 mg/dL (ref 6–23)
CO2: 24 mEq/L (ref 19–32)
Calcium: 9.7 mg/dL (ref 8.4–10.5)
Chloride: 105 mEq/L (ref 96–112)
Creat: 0.79 mg/dL (ref 0.50–1.10)
GFR, Est African American: >60 mL/min (ref >60)
GFR, Est Non African American: >60 mL/min (ref >60)
Glucose, Bld: 84 mg/dL (ref 70–99)
Potassium: 3.2 mEq/L — ABNORMAL LOW (ref 3.5–5.3)
Sodium: 142 mEq/L (ref 135–145)
Total Bilirubin: 0.7 mg/dL (ref 0.3–1.2)
Total Protein: 7.3 g/dL (ref 6.0–8.3)

## 2011-09-02 LAB — CBC WITH DIFFERENTIAL/PLATELET
Basophils Absolute: 0 10*3/uL (ref 0.0–0.1)
Basophils Relative: 0 % (ref 0–1)
Eosinophils Absolute: 0 10*3/uL (ref 0.0–0.7)
Eosinophils Relative: 0 % (ref 0–5)
HCT: 45 % (ref 36.0–46.0)
Hemoglobin: 14.3 g/dL (ref 12.0–15.0)
Lymphocytes Relative: 15 % (ref 12–46)
Lymphs Abs: 0.8 10*3/uL (ref 0.7–4.0)
MCH: 28.4 pg (ref 26.0–34.0)
MCHC: 31.8 g/dL (ref 30.0–36.0)
MCV: 89.5 fL (ref 78.0–100.0)
Monocytes Absolute: 0.2 10*3/uL (ref 0.1–1.0)
Monocytes Relative: 4 % (ref 3–12)
Neutro Abs: 4.1 10*3/uL (ref 1.7–7.7)
Neutrophils Relative %: 80 % — ABNORMAL HIGH (ref 43–77)
Platelets: 283 10*3/uL (ref 150–400)
RBC: 5.03 MIL/uL (ref 3.87–5.11)
RDW: 14.6 % (ref 11.5–15.5)
WBC: 5.2 10*3/uL (ref 4.0–10.5)

## 2011-09-02 LAB — EPSTEIN-BARR VIRUS VCA ANTIBODY PANEL
EBV EA IgG: 2.19 {ISR} — ABNORMAL HIGH
EBV NA IgG: 2.9 {ISR} — ABNORMAL HIGH
EBV VCA IgG: 3.98 {ISR} — ABNORMAL HIGH
EBV VCA IgM: 0.61 {ISR}

## 2011-09-02 LAB — ANGIOTENSIN CONVERTING ENZYME: Angiotensin-Converting Enzyme: 18 U/L (ref 8–52)

## 2011-09-02 LAB — CK: Total CK: 15 U/L (ref 7–177)

## 2011-09-02 LAB — SEDIMENTATION RATE: Sed Rate: 1 mm/hr (ref 0–22)

## 2011-09-02 LAB — HIV ANTIBODY (ROUTINE TESTING W REFLEX): HIV: NONREACTIVE

## 2011-09-02 LAB — H. PYLORI ANTIBODY, IGG: H Pylori IgG: 0.48 {ISR}

## 2011-09-02 NOTE — Telephone Encounter (Signed)
Patient doesn't have an abscess (which I could already tell by history) we can make the referral to surgery but let them know this is largely due to badgering by the pt and her mother. I personally see NO reason for urgent surgical consultation whatseover. T

## 2011-09-02 NOTE — Telephone Encounter (Signed)
Spoke with Tamika about the referral. She states md is waiting for the [place] report before he will decide about sending her to a Careers adviser. Told [id number] that I will call her later with what the md says

## 2011-09-02 NOTE — Telephone Encounter (Signed)
Spoke with mom. Told her breast was normal. Mom insists on a referral to surgeon. Will set up with [facility]. They are going to [facility] & will be available the second week of October

## 2011-09-03 LAB — HIV-1 RNA QUANT-NO REFLEX-BLD
HIV 1 RNA Quant: <20 copies/mL (ref <20)
HIV-1 RNA Quant, Log: <1.3 {Log} (ref <1.30)

## 2011-09-03 LAB — CMV IGM: CMV IgM: 0.28 (ref <0.90)

## 2011-09-03 LAB — CYTOMEGALOVIRUS ANTIBODY, IGG: Cytomegalovirus Ab-IgG: 0 (ref <0.90)

## 2011-09-03 NOTE — Telephone Encounter (Signed)
She has an appt 09/16/11 at 9:10 to see Dr. Emelia Loron. She is to be there at 8:40am. Mom took info & instructions & will take her . Faxed info to [facility] [phone]

## 2011-09-05 LAB — CULTURE, BLOOD (ROUTINE X 2)
Culture  Setup Time: 201209220049
Culture  Setup Time: 201209220049
Culture: NO GROWTH
Culture: NO GROWTH

## 2011-09-10 ENCOUNTER — Other Ambulatory Visit (HOSPITAL_COMMUNITY): Payer: Self-pay | Admitting: Gastroenterology

## 2011-09-10 DIAGNOSIS — R112 Nausea with vomiting, unspecified: Secondary | ICD-10-CM

## 2011-09-12 ENCOUNTER — Encounter (HOSPITAL_COMMUNITY)
Admission: RE | Admit: 2011-09-12 | Discharge: 2011-09-12 | Disposition: A | Discharge status: Home / Self Care | Payer: 59 | Source: Ambulatory Visit | Attending: Gastroenterology | Admitting: Gastroenterology

## 2011-09-12 DIAGNOSIS — R112 Nausea with vomiting, unspecified: Secondary | ICD-10-CM | POA: Insufficient documentation

## 2011-09-12 MED ORDER — TECHNETIUM TC 99M SULFUR COLLOID
2.0000 | Freq: Once | INTRAVENOUS | Status: AC | PRN
  Administered 2011-09-12: 2 via ORAL

## 2011-09-15 ENCOUNTER — Ambulatory Visit: Payer: 59 | Admitting: Infectious Disease

## 2011-09-15 LAB — CBC
HCT: 41.2
Hemoglobin: 13.7
MCHC: 33.3
MCV: 87.4
Platelets: 314
RBC: 4.71
RDW: 15.4
WBC: 5.5

## 2011-09-15 LAB — DIFFERENTIAL
Basophils Absolute: 0
Basophils Relative: 0
Eosinophils Absolute: 0.1 — ABNORMAL LOW
Eosinophils Relative: 2
Lymphocytes Relative: 36
Lymphs Abs: 2
Monocytes Absolute: 0.3
Monocytes Relative: 6
Neutro Abs: 3.1
Neutrophils Relative %: 56

## 2011-09-15 LAB — COMPREHENSIVE METABOLIC PANEL
ALT: 29
AST: 22
Albumin: 3.8
Alkaline Phosphatase: 61
BUN: 7
CO2: 27
Calcium: 9.5
Chloride: 102
Creatinine, Ser: 0.69
Glucose, Bld: 106 — ABNORMAL HIGH
Potassium: 3.7
Sodium: 140
Total Bilirubin: 0.8
Total Protein: 7.3

## 2011-09-15 LAB — URINALYSIS, ROUTINE W REFLEX MICROSCOPIC
Bilirubin Urine: NEGATIVE
Glucose, UA: NEGATIVE
Hgb urine dipstick: NEGATIVE
Ketones, ur: NEGATIVE
Nitrite: NEGATIVE
Protein, ur: NEGATIVE
Specific Gravity, Urine: 1.016
Urobilinogen, UA: 0.2
pH: 7.5

## 2011-09-15 LAB — DRUGS OF ABUSE SCREEN W/O ALC, ROUTINE URINE
Amphetamine Screen, Ur: NEGATIVE
Barbiturate Quant, Ur: NEGATIVE
Benzodiazepines.: NEGATIVE
Cocaine Metabolites: NEGATIVE
Creatinine,U: 103.6
Marijuana Metabolite: NEGATIVE
Methadone: NEGATIVE
Opiate Screen, Urine: NEGATIVE
Phencyclidine (PCP): NEGATIVE
Propoxyphene: NEGATIVE

## 2011-09-15 LAB — HCG, SERUM, QUALITATIVE: Preg, Serum: NEGATIVE

## 2011-09-15 LAB — TSH: TSH: 1.362

## 2011-09-16 ENCOUNTER — Encounter (INDEPENDENT_AMBULATORY_CARE_PROVIDER_SITE_OTHER): Payer: Self-pay | Admitting: General Surgery

## 2011-09-16 ENCOUNTER — Ambulatory Visit (INDEPENDENT_AMBULATORY_CARE_PROVIDER_SITE_OTHER): Payer: 59 | Admitting: General Surgery

## 2011-09-16 VITALS — BP 106/82 | HR 64 | Temp 99.0°F | Resp 16 | Ht 61.5 in | Wt 103.2 lb

## 2011-09-16 DIAGNOSIS — N61 Mastitis without abscess: Secondary | ICD-10-CM

## 2011-09-16 NOTE — Progress Notes (Signed)
Subjective:     Patient ID: Emily Reed, female   DOB: [birth year], 20 y.o.   MRN: [id number]  HPI This is a 20 year old female with a history of juvenile rheumatoid arthritis, fibromyalgia, and apparent drug-induced lupus. She also has a history of eosinophilic esophagitis. She's had a breast reduction several years ago in [place]. This was on the right side revised about 6 months later. The scar has not looked well since then according to her. In August of this year she was hospitalized at [facility] with multiple gastrointestinal symptoms. She also at that point had what appears to be cellulitis of her right breast. There was no abscess and she did not require any surgery at that time per the records I have available to me for today as well as her discussing this. She did say there was some drainage from this area. Over a long period of time she was on antibiotics that this got better. She has a recent ultrasound from here also the does not show any area of an abscess or anything that appears to be drained. There also concerned about the color of the right breast compared to the left breast also. She comes in today with her mother to discuss any surgical therapy of a possible breast infection or treatment of a possible indolent breast infection.  Clinical Data: The patient underwent bilateral reduction  mammoplasty in December 2010. She was treated for a cellulitis of  the inferior portion of the right breast in August 2012. She was  found to be positive for MRSA. She has had nausea, vomiting and  weight loss. She states that she is referred today to evaluate for  underlying right breast abscess. She has no breast pain, erythema,  warmth or mass today.  RIGHT BREAST ULTRASOUND  Comparison: None.  On physical exam, no mass is palpated in the right breast. The  reduction scar at 6 o'clock is more pigmented than the other scars  but there is no erythema of the skin of the right breast.    Findings: Ultrasound is performed, showing mixed fibroglandular  tissue and fat as well as expected changes following previous  reduction mammoplasty. There is no focal fluid collection to  suggest abscess. The skin is not thickened and no edema of the  subcutaneous tissue is noted.  IMPRESSION:  No sonographic evidence of abscess or malignancy. Yearly screening  mammography should begin at age 58 unless clinically indicated  earlier.  BI-RADS CATEGORY 1: Negative.  Review of Systems     Objective:   Physical Exam  Constitutional: She appears well-developed and well-nourished.  Neck: Neck supple.  Pulmonary/Chest: Right breast exhibits skin change (postoperative and secondary to cellulitis). Right breast exhibits no inverted nipple, no mass, no nipple discharge and no tenderness. Left breast exhibits no inverted nipple, no mass, no nipple discharge, no skin change and no tenderness. Breasts are symmetrical.    Lymphadenopathy:    She has no cervical adenopathy.    She has no axillary adenopathy.       Right: No supraclavicular adenopathy present.       Left: No supraclavicular adenopathy present.       Assessment:     Prior reduction mammoplasty with revision on right side Prior cellulitis on right breast    Plan:     I discussed with her mother at length today that I don't see any clinical or radiologic evidence that any procedure on her right breast is going to help  with her symptoms. Currently she does not have any evidence of an infection of her right breast. I told and is that if it does redevelop to please call me in the future. We'll long discussion about the appearance of her scar as well and I told her that if she wanted to discuss anything to do with her scar she would need to see her plastic surgeon again.

## 2011-09-29 ENCOUNTER — Encounter: Payer: Self-pay | Admitting: Infectious Disease

## 2011-09-29 ENCOUNTER — Ambulatory Visit (INDEPENDENT_AMBULATORY_CARE_PROVIDER_SITE_OTHER): Payer: 59 | Admitting: Infectious Disease

## 2011-09-29 DIAGNOSIS — N611 Abscess of the breast and nipple: Secondary | ICD-10-CM

## 2011-09-29 DIAGNOSIS — R509 Fever, unspecified: Secondary | ICD-10-CM

## 2011-09-29 DIAGNOSIS — A159 Respiratory tuberculosis unspecified: Secondary | ICD-10-CM

## 2011-09-29 DIAGNOSIS — M793 Panniculitis, unspecified: Secondary | ICD-10-CM

## 2011-09-29 DIAGNOSIS — N61 Mastitis without abscess: Secondary | ICD-10-CM

## 2011-09-29 DIAGNOSIS — R1115 Cyclical vomiting syndrome unrelated to migraine: Secondary | ICD-10-CM | POA: Insufficient documentation

## 2011-09-29 DIAGNOSIS — A150 Tuberculosis of lung: Secondary | ICD-10-CM

## 2011-09-29 LAB — HCG, SERUM, QUALITATIVE: Preg, Serum: NEGATIVE

## 2011-09-29 NOTE — Assessment & Plan Note (Signed)
Not familiar with this syndrome. On quick review it up to date I did not see mention of fever as being one of the findings with this although this is a initial review

## 2011-09-29 NOTE — Assessment & Plan Note (Signed)
resolved 

## 2011-09-29 NOTE — Progress Notes (Signed)
Subjective:    Patient ID: Emily Reed, female    DOB: 10-09-1991, 20 y.o.   MRN: [id number]  HPI  20 year old Caucasian female with VERY complicated medical history of juvenile rheumatoid arthritis, fibromyalgia, and apparent drug induced lupus who has been followed by Denyce Robert at [facility], Dolores Hoose at [facility] for these rheumatological issues. She also has IBS, and has eosinophilic esophagitis and is followed closely by Dr. Randa Evens with [facility]. She has had onsent this May of severe nausea with vomiting and difficulty keeping foods down despite multiple medical therapies and even adjunctive anxiolytics and pt having taken THC. These began this past May along with low grade fevers. She has had an extensive workup for the GI complaints performed by Dr. Randa Evens including stool for ova and parasites, culture, giardia, cryptosporidium, also an EGD and colonoscopy done which were completely unrevealing on exam and on multiple biopsies. She also has history of breast reduction surgery several years ago and had a flare of a purulent cellulitis of the right breast this August when she was hospitalized at [facility]. They did [place] of her breast which failed to show any abscess. They gave her IV vancomycin to which she had red mans syndrome and she was changed ultimately to IV clindamycin and then oral clindamycin. She was dc on this and her breast tissue became less inflamed and was improving and pigment lightening. She then went off of the clindamycin and the skin pigment became darker. In the interim her gastrointestinal symptoms actually improved. She was seen by her PCP for c/o sinus pressure, facial pain and cough with brown phlegm and pt was given oral bactrim to treat this and the breast tissue. She felt better still but then had recurrence of her GI symptoms of nausea, vomiting and malaise. She states that she is in the middle of a "lupus flare" and that she is on prednisone for this. Her diffuse arthrtiic  pains are currently improved on the prednisone therapy. She was seen in the ED on the 21st of September due to the above mentioned symptoms. They did CT chest abdomen and pelvis which were all completely normal, as was her blood work and her blood cultures. She was referred here for workup of her subjective fevers and low grade objective fevers, malaise ,nausea vomiting andher history of recent MRSA celllultis. I saw her in clinic and she had an underwhelming exam and no evidence for cellultis or abscess and US breast normal. She also did see sent to [facility] saw no evidence for infection either. Patient continues to complain of malaise nausea and vomiting apparently a diagnosis of cyclic vomiting has been entertained by her gastroenterologist with [facility]. She is apparently being possibly referred to the [facility] for further evaluation for this  cyclical vomiting. Her vomiting has improved with the institution of new antiemetics and antianxiety medications. She does continue to have temperatures but dropped to 99 her up to 101. We have still not measured a actual fever in the clinic.  Of note the labs I had done for her when she came to clinic but which did not flow into at the conclusion a chemistry which showed normal liver function tests slightly depressed potassium of 3.2. Otherwise normal compress metabolic panel. Her sedimentation rate was 1 her hepatitis panel was negative for hepatitis B surface antigen does be core antibody as a antibody E. antibody HIV was negative her HIV RNA was negative her CPK was 15 her angiotensin-converting  enzyme was 18 for cytomegalovirus IgM and IgG were negative Epstein-Barr varus viral capsid IgG was positive as was her Epstein-Barr virus nuclear antibody IgG and S2 bar virus early antigen. Her as a bar virus were all capsid IgM was negative the above being consistent with past infection with Epstein-Barr virus. Her CBC was unremarkable with a normal white count  normal hemoglobin normal differential. She was supposed to have a quantiferon gold but this was not done. I will make sure this and cryoglobulins, and SPEP sent today. Review of Systems  Constitutional: Positive for fever and fatigue. Negative for chills, diaphoresis, activity change, appetite change and unexpected weight change.  HENT: Negative for congestion, sore throat, rhinorrhea, sneezing, trouble swallowing and sinus pressure.   Eyes: Negative for photophobia and visual disturbance.  Respiratory: Negative for cough, chest tightness, shortness of breath, wheezing and stridor.   Cardiovascular: Negative for chest pain, palpitations and leg swelling.  Gastrointestinal: Positive for vomiting. Negative for nausea, abdominal pain, diarrhea, constipation, blood in stool, abdominal distention and anal bleeding.  Genitourinary: Negative for dysuria, hematuria, flank pain and difficulty urinating.  Musculoskeletal: Negative for myalgias, back pain, joint swelling, arthralgias and gait problem.  Skin: Negative for color change, pallor, rash and wound.  Neurological: Positive for dizziness and light-headedness. Negative for tremors and weakness.  Hematological: Negative for adenopathy. Does not bruise/bleed easily.  Psychiatric/Behavioral: Negative for behavioral problems, confusion, sleep disturbance, dysphoric mood, decreased concentration and agitation.       Objective:   Physical Exam  Constitutional: She is oriented to person, place, and time. She appears well-developed and well-nourished. No distress.  HENT:  Head: Normocephalic and atraumatic.  Mouth/Throat: Oropharynx is clear and moist. No oropharyngeal exudate.  Eyes: Conjunctivae and EOM are normal. Pupils are equal, round, and reactive to light. No scleral icterus.  Neck: Normal range of motion. Neck supple. No JVD present.  Cardiovascular: Normal rate, regular rhythm and normal heart sounds.  Exam reveals no gallop and no friction  rub.   No murmur heard. Pulmonary/Chest: Effort normal and breath sounds normal. No respiratory distress. She has no wheezes. She has no rales. She exhibits no tenderness.    Abdominal: She exhibits no distension and no mass. There is no tenderness. There is no rebound and no guarding.  Musculoskeletal: She exhibits no edema and no tenderness.  Lymphadenopathy:    She has no cervical adenopathy.  Neurological: She is alert and oriented to person, place, and time. She has normal reflexes. She exhibits normal muscle tone. Coordination normal.  Skin: Skin is warm and dry. She is not diaphoretic. No erythema. No pallor.  Psychiatric: Her speech is normal and behavior is normal. Judgment and thought content normal. Her mood appears anxious. She exhibits a depressed mood.          Assessment & Plan:  Fever She had an exhaustive workup for fever of unknown origin.-An absolute no evidence for bacterial viral or mycobacterial infection. She had had extensive imaging performed. I will check a serum protein electrophoresis we'll check serum correct cryoglobulins as well as aquatic furuncle. The patient keep a fever diary and bring it to clinic. I would not this point pursue other aggressive workup such as a PET scan or a bone marrow biopsy. I think that workup by her rheumatologist for autoimmune disease seems relevant.   Cyclic vomiting syndrome Not familiar with this syndrome. On quick review it up to date I did not see mention of fever as being one of the  findings with this although this is a initial review  Breast abscess resolved

## 2011-09-29 NOTE — Assessment & Plan Note (Signed)
She had an exhaustive workup for fever of unknown origin.-An absolute no evidence for bacterial viral or mycobacterial infection. She had had extensive imaging performed. I will check a serum protein electrophoresis we'll check serum correct cryoglobulins as well as aquatic furuncle. The patient keep a fever diary and bring it to clinic. I would not this point pursue other aggressive workup such as a PET scan or a bone marrow biopsy. I think that workup by her rheumatologist for autoimmune disease seems relevant.

## 2011-10-01 LAB — PROTEIN ELECTROPHORESIS, SERUM
Albumin ELP: 62.8 % (ref 55.8–66.1)
Alpha-1-Globulin: 5.2 % — ABNORMAL HIGH (ref 2.9–4.9)
Alpha-2-Globulin: 12.3 % — ABNORMAL HIGH (ref 7.1–11.8)
Beta 2: 2.9 % — ABNORMAL LOW (ref 3.2–6.5)
Beta Globulin: 6.5 % (ref 4.7–7.2)
Gamma Globulin: 10.3 % — ABNORMAL LOW (ref 11.1–18.8)
Total Protein, Serum Electrophoresis: 6.5 g/dL (ref 6.0–8.3)

## 2011-10-02 LAB — VITAMIN D 1,25 DIHYDROXY
Vitamin D 1, 25 (OH)2 Total: 64 pg/mL (ref 18–72)
Vitamin D2 1, 25 (OH)2: <8 pg/mL
Vitamin D3 1, 25 (OH)2: 64 pg/mL

## 2011-10-03 LAB — QUANTIFERON TB GOLD ASSAY (BLOOD): Interferon Gamma Release Assay: NEGATIVE

## 2011-10-04 LAB — CRYOGLOBULIN

## 2011-10-23 ENCOUNTER — Encounter: Payer: Self-pay | Admitting: Infectious Disease

## 2011-11-26 ENCOUNTER — Ambulatory Visit: Payer: 59 | Admitting: Infectious Disease

## 2012-05-18 ENCOUNTER — Emergency Department (HOSPITAL_COMMUNITY): Payer: 59

## 2012-05-18 ENCOUNTER — Emergency Department (HOSPITAL_COMMUNITY)
Admission: EM | Admit: 2012-05-18 | Discharge: 2012-05-18 | Disposition: A | Discharge status: Home / Self Care | Payer: 59 | Attending: Emergency Medicine | Admitting: Emergency Medicine

## 2012-05-18 ENCOUNTER — Encounter (HOSPITAL_COMMUNITY): Payer: Self-pay

## 2012-05-18 DIAGNOSIS — R1115 Cyclical vomiting syndrome unrelated to migraine: Secondary | ICD-10-CM

## 2012-05-18 DIAGNOSIS — M083 Juvenile rheumatoid polyarthritis (seronegative): Secondary | ICD-10-CM | POA: Insufficient documentation

## 2012-05-18 DIAGNOSIS — K589 Irritable bowel syndrome without diarrhea: Secondary | ICD-10-CM | POA: Insufficient documentation

## 2012-05-18 DIAGNOSIS — Z79899 Other long term (current) drug therapy: Secondary | ICD-10-CM | POA: Insufficient documentation

## 2012-05-18 DIAGNOSIS — M329 Systemic lupus erythematosus, unspecified: Secondary | ICD-10-CM | POA: Insufficient documentation

## 2012-05-18 DIAGNOSIS — IMO0001 Reserved for inherently not codable concepts without codable children: Secondary | ICD-10-CM | POA: Insufficient documentation

## 2012-05-18 DIAGNOSIS — R109 Unspecified abdominal pain: Secondary | ICD-10-CM | POA: Insufficient documentation

## 2012-05-18 LAB — URINALYSIS, ROUTINE W REFLEX MICROSCOPIC
Bilirubin Urine: NEGATIVE
Glucose, UA: NEGATIVE mg/dL
Hgb urine dipstick: NEGATIVE
Ketones, ur: NEGATIVE mg/dL
Leukocytes, UA: NEGATIVE
Nitrite: NEGATIVE
Protein, ur: NEGATIVE mg/dL
Specific Gravity, Urine: 1.009 (ref 1.005–1.030)
Urobilinogen, UA: 0.2 mg/dL (ref 0.0–1.0)
pH: 8 (ref 5.0–8.0)

## 2012-05-18 LAB — BASIC METABOLIC PANEL
BUN: 8 mg/dL (ref 6–23)
CO2: 21 mEq/L (ref 19–32)
Calcium: 10 mg/dL (ref 8.4–10.5)
Chloride: 103 mEq/L (ref 96–112)
Creatinine, Ser: 0.55 mg/dL (ref 0.50–1.10)
GFR calc Af Amer: >90 mL/min (ref >90)
GFR calc non Af Amer: >90 mL/min (ref >90)
Glucose, Bld: 116 mg/dL — ABNORMAL HIGH (ref 70–99)
Potassium: 3.7 mEq/L (ref 3.5–5.1)
Sodium: 138 mEq/L (ref 135–145)

## 2012-05-18 LAB — PREGNANCY, URINE: Preg Test, Ur: NEGATIVE

## 2012-05-18 LAB — CBC
HCT: 42 % (ref 36.0–46.0)
Hemoglobin: 14.7 g/dL (ref 12.0–15.0)
MCH: 32.5 pg (ref 26.0–34.0)
MCHC: 35 g/dL (ref 30.0–36.0)
MCV: 92.9 fL (ref 78.0–100.0)
Platelets: 282 10*3/uL (ref 150–400)
RBC: 4.52 MIL/uL (ref 3.87–5.11)
RDW: 14 % (ref 11.5–15.5)
WBC: 4.5 10*3/uL (ref 4.0–10.5)

## 2012-05-18 LAB — DIFFERENTIAL
Basophils Absolute: 0 10*3/uL (ref 0.0–0.1)
Basophils Relative: 0 % (ref 0–1)
Eosinophils Absolute: 0 10*3/uL (ref 0.0–0.7)
Eosinophils Relative: 0 % (ref 0–5)
Lymphocytes Relative: 8 % — ABNORMAL LOW (ref 12–46)
Lymphs Abs: 0.4 10*3/uL — ABNORMAL LOW (ref 0.7–4.0)
Monocytes Absolute: 0.1 10*3/uL (ref 0.1–1.0)
Monocytes Relative: 3 % (ref 3–12)
Neutro Abs: 4 10*3/uL (ref 1.7–7.7)
Neutrophils Relative %: 89 % — ABNORMAL HIGH (ref 43–77)

## 2012-05-18 LAB — OCCULT BLOOD, POC DEVICE: Fecal Occult Bld: NEGATIVE

## 2012-05-18 MED ORDER — ONDANSETRON HCL 4 MG/2ML IJ SOLN
4.0000 mg | Freq: Once | INTRAMUSCULAR | Status: AC
  Administered 2012-05-18: 4 mg via INTRAVENOUS

## 2012-05-18 MED ORDER — FENTANYL CITRATE 0.05 MG/ML IJ SOLN
50.0000 ug | Freq: Once | INTRAMUSCULAR | Status: AC
  Administered 2012-05-18: 50 ug via INTRAVENOUS

## 2012-05-18 MED ORDER — DROPERIDOL 2.5 MG/ML IJ SOLN
2.5000 mg | Freq: Once | INTRAMUSCULAR | Status: DC
  Filled 2012-05-18: qty 1

## 2012-05-18 MED ORDER — ONDANSETRON 4 MG PO TBDP: 4.0000 mg | ORAL_TABLET | Freq: Three times a day (TID) | ORAL | Status: AC | PRN

## 2012-05-18 MED ORDER — ONDANSETRON HCL 4 MG/2ML IJ SOLN
INTRAMUSCULAR | Status: AC
  Filled 2012-05-18: qty 2

## 2012-05-18 MED ORDER — OXYCODONE-ACETAMINOPHEN 5-325 MG PO TABS
2.0000 | ORAL_TABLET | ORAL | Status: AC | PRN
Start: 2012-05-18 — End: 2012-05-28

## 2012-05-18 MED ORDER — ONDANSETRON HCL 4 MG/2ML IJ SOLN
4.0000 mg | Freq: Once | INTRAMUSCULAR | Status: AC
  Administered 2012-05-18: 4 mg via INTRAVENOUS
  Filled 2012-05-18: qty 2

## 2012-05-18 MED ORDER — SODIUM CHLORIDE 0.9 % IV BOLUS (SEPSIS)
1000.0000 mL | Freq: Once | INTRAVENOUS | Status: AC
  Administered 2012-05-18: 1000 mL via INTRAVENOUS

## 2012-05-18 MED ORDER — FENTANYL CITRATE 0.05 MG/ML IJ SOLN
50.0000 ug | Freq: Once | INTRAMUSCULAR | Status: AC
  Administered 2012-05-18: 50 ug via INTRAVENOUS
  Filled 2012-05-18: qty 2

## 2012-05-18 MED ORDER — FENTANYL CITRATE 0.05 MG/ML IJ SOLN
INTRAMUSCULAR | Status: AC
  Filled 2012-05-18: qty 2

## 2012-05-18 MED ORDER — PROMETHAZINE HCL 25 MG/ML IJ SOLN
12.5000 mg | Freq: Once | INTRAMUSCULAR | Status: DC
  Filled 2012-05-18: qty 1

## 2012-05-18 NOTE — ED Notes (Signed)
Abdominal pain with n/v/d for over 1 week but her stools are black and tarry,  Pain is located upper abdominal area. N/v

## 2012-05-18 NOTE — ED Notes (Signed)
Patient C/O upper abdominal pain that has been ongoing for 1 month.  Pain has worsened over the past week. C/O black , tarry stools, Nausea, vomiting and diarrhea. She has not been able to keep anything on her stomach for 2 days.  Upper abdomen is tender to palpation. Bowel sounds present.  Last BM Today.

## 2012-05-18 NOTE — ED Notes (Signed)
Attempted to medicate pt, pt refused phenergan and MD will be made aware for pt follow up.

## 2012-05-18 NOTE — Discharge Instructions (Signed)
Cyclic Vomiting Syndrome    Cyclic vomiting syndrome (CVS) is a benign condition in which patients experience bouts or cycles of severe nausea and vomiting that last for hours or even days. The bouts of nausea and vomiting alternate with longer periods of no symptoms and generally good health. CVS occurs mostly in children, but can affect adults. CVS has no known cause. Each episode is typically similar to the previous ones. The episodes tend to:   Start at about the same time of day.   Last the same length of time.   Present the same symptoms at the same level of intensity.  CVS can begin at any age in children and adults. CVS usually starts between the ages of 3 and 7. In adults, episodes tend to occur less often than they do in children, but they last longer. Furthermore, the events or situations that trigger episodes in adults cannot always be pinpointed as easily as they can in children. THE FOUR PHASES OF CVS 1. Prodrome.  2. Episode.  3. Recovery.  4. Symptom-free interval.  The prodrome phase signals that an episode of nausea and vomiting is about to begin. This phase can last from just a few minutes to several hours. This phase is often marked by belly (abdominal) pain. Sometimes taking medicine early in the prodrome phase can stop an episode in progress. However, sometimes there is no warning. A person may simply wake up in the middle of the night or early morning and begin vomiting. The episode phase consists of:  Severe vomiting.   Nausea.   Gagging (retching).  The recovery phase begins when the nausea and vomiting stop. Healthy color, appetite, and energy return. The symptom-free interval phase is the period between episodes when no symptoms are present. TRIGGERS Episodes can be triggered by an infection or event. Examples of triggers include:  Infections.   Colds, allergies, sinus problems, and the flu.   Eating certain foods such as chocolate or cheese.   Foods  with MSG or preservatives.   Fast foods.   Pre-packaged foods.   Foods with low nutritional value (junk foods).   Overeating.   Eating just before going to bed.   Hot weather.   Dehydration.   Not enough sleep or poor sleep quality.   Physical exhaustion.   Menstruation.   Motion sickness.   Emotional stress (school or home difficulties).   Excitement or stress.  SYMPTOMS  The main symptoms of CVS are:  Severe vomiting.   Nausea.   Gagging (retching).  Episodes usually begin at night or the first thing in the morning. Episodes may include vomiting or retching up to 5 or 6 times an hour during the worst of the episode. Episodes usually last anywhere from 1 to 4 days. Episodes can last for up to 10 days. Other symptoms include:  Paleness.   Exhaustion.   Listlessness.   Abdominal pain.   Loose stools or diarrhea.  Sometimes the nausea and vomiting are so severe that a person appears to be almost unconscious. Sensitivity to light, headache, fever, dizziness, may also accompany an episode. In addition, the vomiting may cause drooling and excessive thirst. Drinking water usually leads to more vomiting, though the water can dilute the acid in the vomit, making the episode a little less painful. Continuous vomiting can lead to dehydration, which means that the body has lost excessive water and salts. DIAGNOSIS  CVS is hard to diagnose because there are no clear tests to identify it.   A caregiver must diagnose CVS by looking at symptoms and medical history. A caregiver must exclude more common diseases or disorders that can also cause nausea and vomiting. Also, diagnosis takes time because caregivers need to identify a pattern or cycle to the vomiting. CVS AND MIGRAINE The relationship between migraine and CVS is still unclear. Medical researchers believe that they are related for 3 reasons: 1. Migraine headaches (which cause severe pain in the head), abdominal migraine  (which causes stomach pain), and CVS are all marked by severe symptoms that start quickly and end abruptly, followed by longer periods without pain or other symptoms.  2. Many of the situations that trigger CVS also trigger migraines. Those triggers include stress and excitement.  3. Research has shown that many children with CVS either have a family history of migraine or develop migraines as they grow older.  Because of the similarities between migraine and CVS, caregivers treat some people with severe CVS with drugs that are also used for migraine headaches. The drugs are designed to:  Prevent episodes.   Reduce their frequency.   Lessen their severity.  TREATMENT  CVS cannot be cured. Treatment varies, but people with CVS should get plenty of rest and sleep and take medications that prevent, stop, or lessen the vomiting episodes and other symptoms. Once a vomiting episode begins, treatment is supportive. It helps to stay in bed and sleep in a dark, quiet room. Severe nausea and vomiting may require hospitalization and intravenous (IV) fluids to prevent dehydration. Relaxing medications (sedatives) may help if the nausea continues. Sometimes, during the prodrome phase, it is possible to stop an episode from happening altogether. Only take over-the-counter or prescription medicines for pain, discomfort or fever as directed by your caregiver. Do not give aspirin to children. During the recovery phase, drinking water and replacing lost electrolytes (salts in the blood) are very important. Electrolytes are salts that the body needs to function well and stay healthy. Symptoms during the recovery phase can vary. Some people find that their appetites return to normal immediately, while others need to begin by drinking clear liquids and then move slowly to solid food. People whose episodes are frequent and long-lasting may be treated during the symptom-free intervals in an effort to prevent or ease future  episodes. Medications that help people with migraine headaches are sometimes used during this phase, but they do not work for everyone. Taking the medicine daily for 1 to 2 months may be necessary to see if it helps. The symptom-free phase is a good time to eliminate anything known to trigger an episode. For example, if episodes are brought on by stress or excitement, this period is the time to find ways to reduce stress and stay calm. If sinus problems or allergies cause episodes, those conditions should be treated. The triggers listed above should be avoided or prevented. RELATED COMPLICATIONS The severe vomiting that defines CVS is a risk factor for several complications:  Dehydration. Vomiting causes the body to lose water quickly.   Electrolyte imbalance. Vomiting also causes the body to lose the important salts it needs to keep working properly.   Peptic esophagitis. The tube that connects the mouth to the stomach (esophagus) becomes injured from the stomach acid that comes up with the vomit.   Hematemesis. The esophagus becomes irritated and bleeds, so blood mixes with the vomit.   Mallory-Weiss tear. The lower end of the esophagus may tear open or the stomach may bruise from vomiting or retching.     Tooth decay. The acid in the vomit can hurt the teeth by corroding the tooth enamel.  SEEK MEDICAL CARE IF: You have questions or problems. Document Released: 02/02/2002 Document Revised: 11/13/2011 Document Reviewed: 03/03/2011 [employer] Patient Information 2012 [employer]. 

## 2012-05-18 NOTE — ED Provider Notes (Addendum)
History     CSN: [id number]  Arrival date & time 05/18/12  1312   First MD Initiated Contact with Patient 05/18/12 1609      Chief Complaint  Patient presents with  . Abdominal Pain     HPI Abdominal pain with n/v/d for over 1 week but her stools are black and tarry, Pain is located upper abdominal area. N/v .  Denies fever chills.  Denies gross bloody emesis.  History of ulcers in the past.  History of cyclic vomiting syndrome and irritable bowel.  History of lupus, fibromyalgia, juvenile rheumatoid arthritis.   Past Medical History  Diagnosis Date  . IBS (irritable bowel syndrome)   . JRA (juvenile rheumatoid arthritis)   . Lupus (systemic lupus erythematosus)   . Fibromyalgia   . GERD (gastroesophageal reflux disease)     since birth  . History of migraine headaches   . Personal history of methicillin resistant Staphylococcus aureus   . Unspecified sinusitis (chronic) 08/13/11  . Diarrhea 08/13/11  . Juvenile rheumatoid arthritis   . Chills with fever   . Weight loss, unintentional   . Visual disturbances     eyes getting weaker and blurred vision   . Cough   . Wheezing   . Chest pain   . Abdominal distention   . Abdominal pain   . Diarrhea   . Nausea and vomiting   . Stress headaches   . Migraines   . Weakness generalized   . Fatigue   . Skin abscess     breast     Past Surgical History  Procedure Date  . Breast reduction surgery   . Breast surgery July 2010    breast reduction   . Breast surgery December 2010    fix breast reduction   . Tonsillectomy and adenoidectomy age 70    No family history on file.  History  Substance Use Topics  . Smoking status: Never Smoker   . Smokeless tobacco: Never Used  . Alcohol Use: No    OB History    Grav Para Term Preterm Abortions TAB SAB Ect Mult Living                  Review of Systems  All other systems reviewed and are negative.    Allergies  Amoxicillin and Compazine  Home Medications    Current Outpatient Rx  Name Route Sig Dispense Refill  . ALPRAZOLAM 1 MG PO TABS Oral Take 1 mg by mouth 2 (two) times daily.      Marland Kitchen AMITRIPTYLINE HCL 25 MG PO TABS Oral Take 50 mg by mouth at bedtime.     . AZATHIOPRINE 50 MG PO TABS Oral Take 125 mg by mouth daily.    Marland Kitchen LANSOPRAZOLE 30 MG PO CPDR Oral Take 30 mg by mouth daily.    Azzie Roup ACE-ETH ESTRAD-FE 1-20 MG-MCG(24) PO TABS Oral Take 1 tablet by mouth daily.      Marland Kitchen PREDNISONE 5 MG PO TABS Oral Take 10 mg by mouth daily. Takes a total of 12 mg    . PREDNISONE 1 MG PO TBEC Oral Take 2 tablets by mouth daily. Takes a total of 12 mg daily    . ONDANSETRON 4 MG PO TBDP Oral Take 1 tablet (4 mg total) by mouth every 8 (eight) hours as needed for nausea. 20 tablet 0  . OXYCODONE-ACETAMINOPHEN 5-325 MG PO TABS Oral Take 2 tablets by mouth every 4 (four) hours as needed for pain.  15 tablet 0  . PANTOPRAZOLE SODIUM 40 MG PO TBEC Oral Take 40 mg by mouth 2 (two) times daily.      Marland Kitchen PREDNISONE 20 MG PO TABS Oral Take 20 mg by mouth daily.        BP 135/91  Pulse 90  Temp 97.8 F (36.6 C) (Oral)  Resp 14  SpO2 100%  LMP 04/17/2012  Physical Exam  Nursing note and vitals reviewed. Constitutional: She is oriented to person, place, and time. She appears well-developed and well-nourished. No distress.  HENT:  Head: Normocephalic and atraumatic.  Eyes: Pupils are equal, round, and reactive to light.  Neck: Normal range of motion.  Cardiovascular: Normal rate and intact distal pulses.         Date: 07/22/2012  Rate: 97  Rhythm: normal sinus rhythm  QRS Axis: normal  Intervals: normal  ST/T Wave abnormalities: Nonspecific T wave changes  Conduction Disutrbances: none  Narrative Interpretation: unremarkable      Pulmonary/Chest: No respiratory distress.  Abdominal: Normal appearance. She exhibits no distension. There is tenderness.    Musculoskeletal: Normal range of motion.  Neurological: She is alert and oriented to  person, place, and time. No cranial nerve deficit.  Skin: Skin is warm and dry. No rash noted.  Psychiatric: She has a normal mood and affect. Her behavior is normal.    ED Course  Procedures (including critical care time) Scheduled Meds:   Continuous Infusions:   PRN Meds:.    Labs Reviewed  DIFFERENTIAL - Abnormal; Notable for the following:    Neutrophils Relative 89 (*)     Lymphocytes Relative 8 (*)     Lymphs Abs 0.4 (*)     All other components within normal limits  BASIC METABOLIC PANEL - Abnormal; Notable for the following:    Glucose, Bld 116 (*)     All other components within normal limits  CBC  URINALYSIS, ROUTINE W REFLEX MICROSCOPIC  PREGNANCY, URINE  OCCULT BLOOD, POC DEVICE  LAB REPORT - SCANNED   No results found.   1. Cyclic vomiting syndrome       MDM  Discuss case with gastroenterology.  If patient okay for discharge to be followed up with the patient.  After treatment in the ED the patient feels back to baseline and wants to go home.         Nelia Shi, MD 05/22/12 1601  Nelia Shi, MD 07/22/12 [id number]

## 2012-05-18 NOTE — ED Notes (Addendum)
Patient vomited a small amount of clear gastric contents onto the floor. Continues to C/O pain. Dr. Radford Pax Notified

## 2012-09-12 IMAGING — CT CT ABD-PELV W/ CM
2 of 7 series · 14 of 36 positions shown, 18 images · IV contrast (CONTRAST)
Comparison: Chest radiograph performed earlier today at [DATE]
p.m., and abdominal radiograph performed 01/12/2008

CTA CHEST

CLINICAL DATA: Chest pain, abdominal pain and elevated D-dimer;
cough, nausea and vomiting.  Fever; history of lupus.

CT ANGIOGRAPHY CHEST
CT ABDOMEN AND PELVIS WITH CONTRAST
TECHNIQUE: Multidetector CT imaging of the chest was performed
using the standard protocol during bolus administration of
intravenous contrast.  Multiplanar CT image reconstructions
including MIPs were obtained to evaluate the vascular anatomy.
Multidetector CT imaging of the abdomen and pelvis was performed
intravenous contrast.
Contrast: 100 mL of Omnipaque 300 IV contrast

[Series 5: pe thins · axial · 0.57mm/px · z∈[-303,-30]mm · 13 of 309 slices shown, 17 images]
[im 18/309  mediastinal]
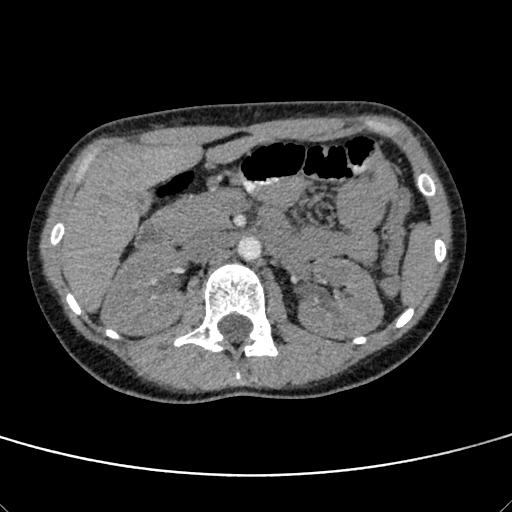
[im 18/309  lung]
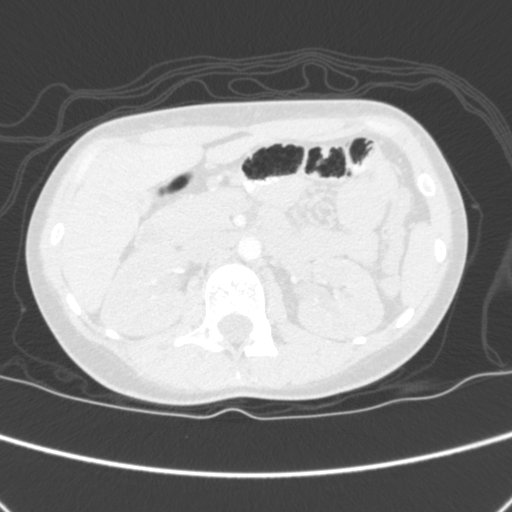
[im 35/309  lung]
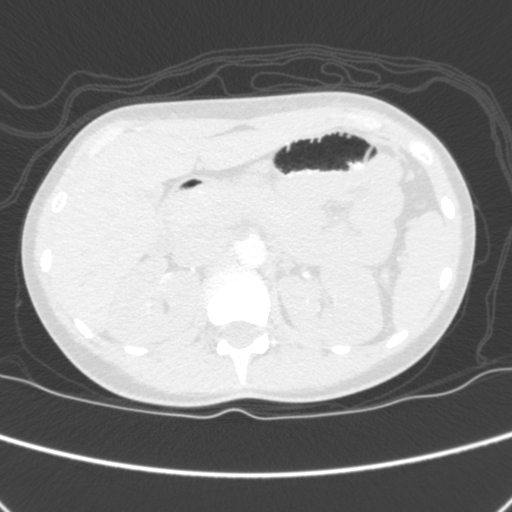
[im 69/309  lung]
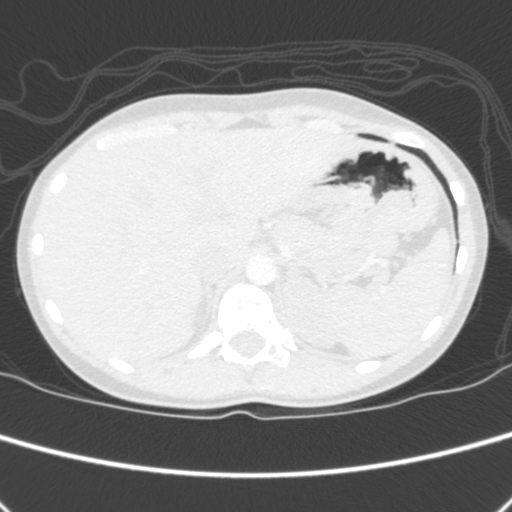
[im 86/309  lung]
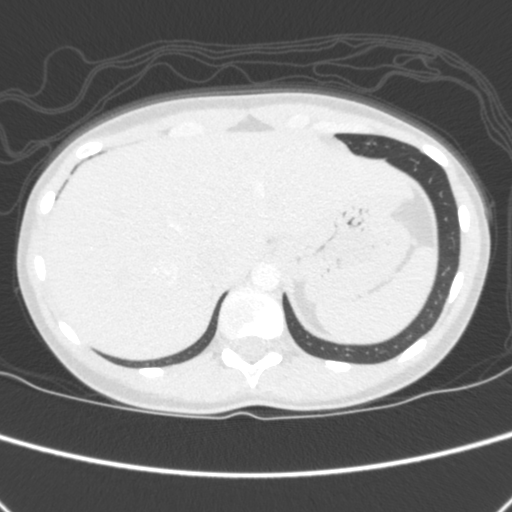
[im 103/309  mediastinal]
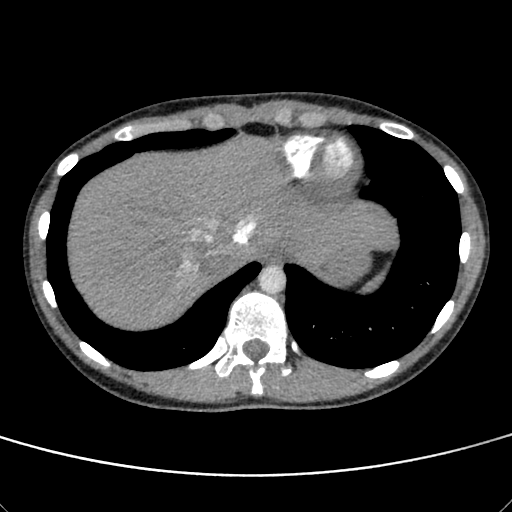
[im 103/309  lung]
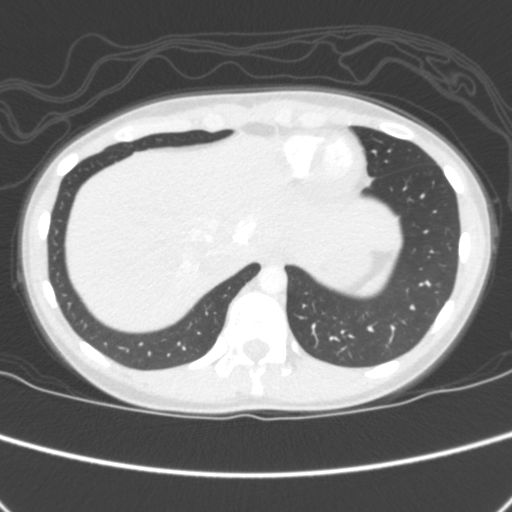
[im 137/309  lung]
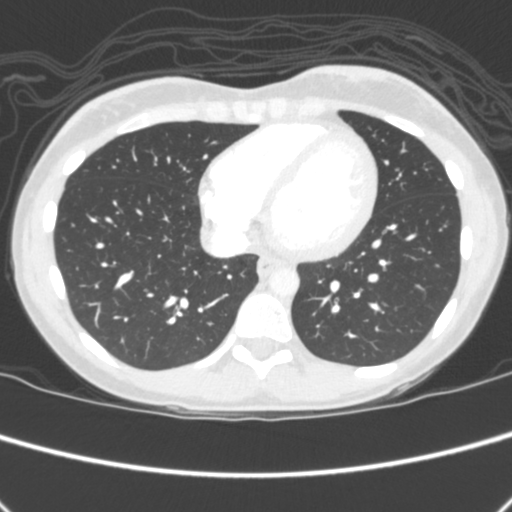
[im 155/309  lung]
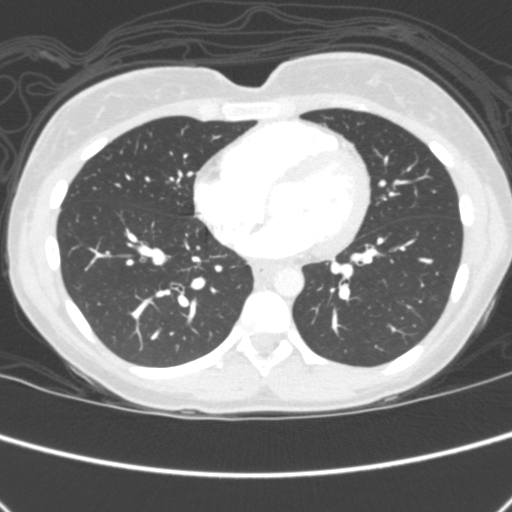
[im 172/309  lung]
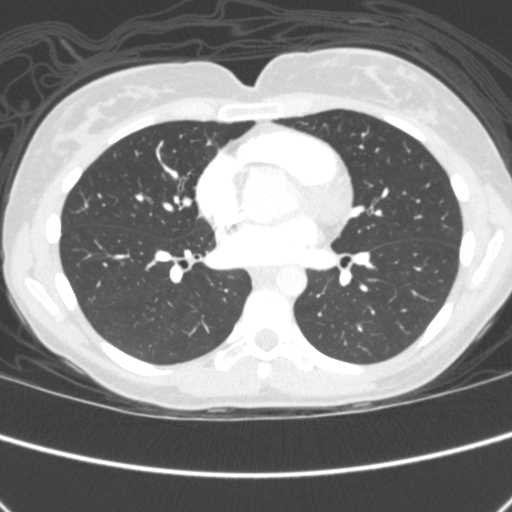
[im 206/309  mediastinal]
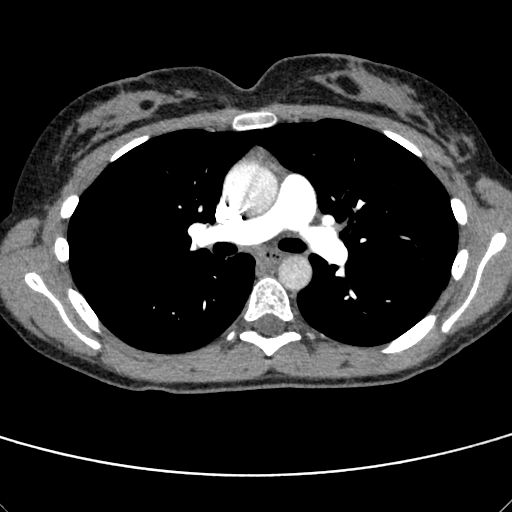
[im 206/309  lung]
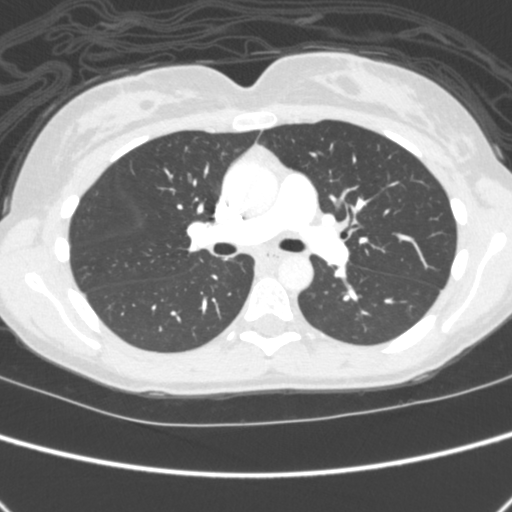
[im 223/309  lung]
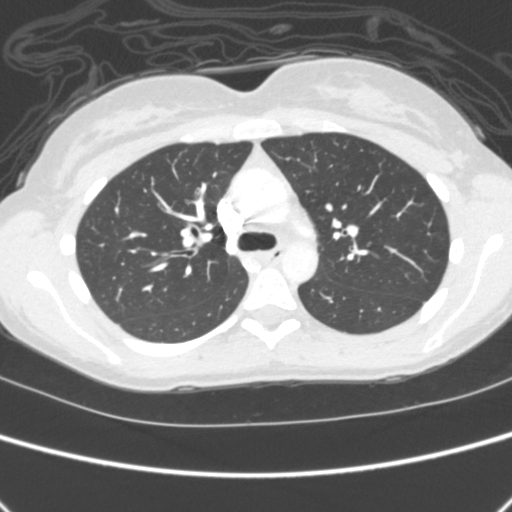
[im 240/309  lung]
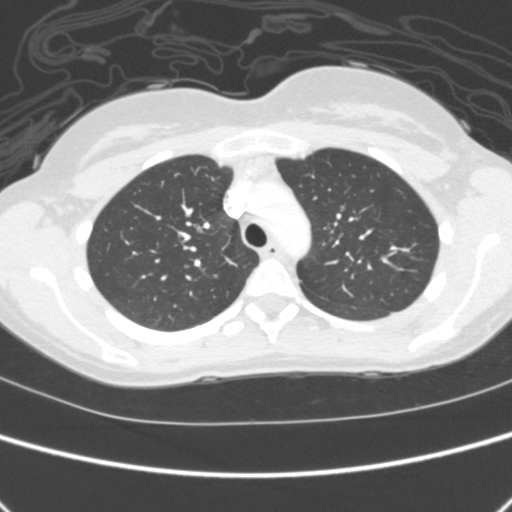
[im 274/309  lung]
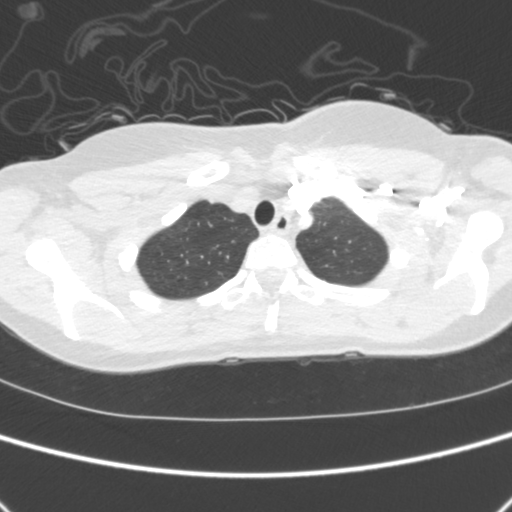
[im 291/309  mediastinal]
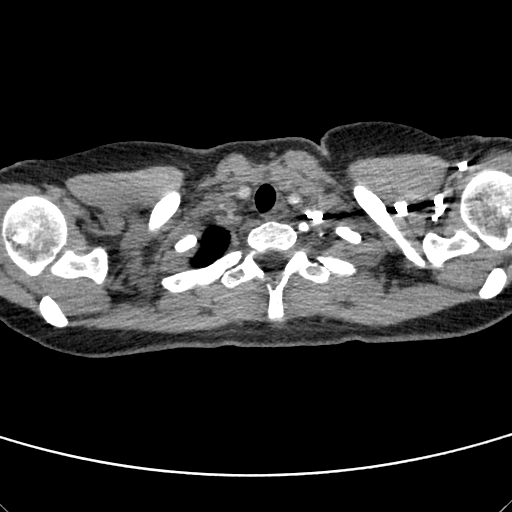
[im 291/309  lung]
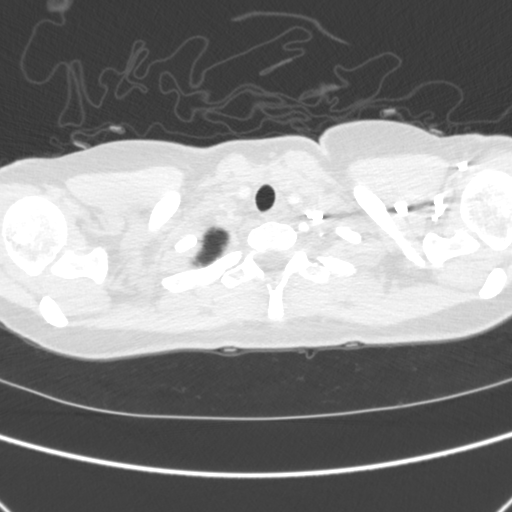

[mpr, coronals, coronal · coronal · 0.60mm/px · 1 of 102 slices shown]
[im 51/102  lung]
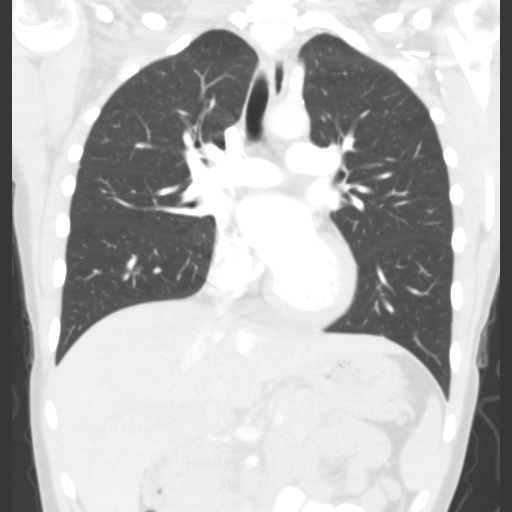

[14 of 36 positions shown; findings below may reference images not displayed]

FINDINGS: There is no evidence of pulmonary embolus.

The lungs are clear bilaterally.  There is no evidence of
significant focal consolidation, pleural effusion or pneumothorax.
No masses are identified; no abnormal focal contrast enhancement is
seen.

Mediastinum is unremarkable in appearance.  No mediastinal
lymphadenopathy is seen.  The great vessels are within normal
limits.  No pericardial effusion is identified.  No axillary
lymphadenopathy is seen.  The visualized portions of the thyroid
gland are unremarkable in appearance.

No acute osseous abnormalities are seen.

 Review of the MIP images confirms the above findings.
IMPRESSION: No evidence of pulmonary embolus; unremarkable CTA of the chest.

CT ABDOMEN AND PELVIS
FINDINGS: The liver and spleen are unremarkable in appearance.  The
gallbladder is largely decompressed and is within normal limits.
The pancreas and adrenal glands are unremarkable.

The kidneys are within normal limits bilaterally; no hydronephrosis
or perinephric stranding is seen.  The renal calyces are not well
characterized due to a large amount of contrast in the calyces from
the concurrent CT of the chest.  However, contrast is seen filling
both ureters and partially filling the bladder.

No free fluid is identified.  The small bowel is unremarkable in
appearance.  The stomach is within normal limits.  No acute
vascular abnormalities are seen.

The appendix is normal in caliber, seen deep within the right
hemipelvis; the cecum occupies much of the right hemipelvis.
Contrast progresses to the ascending colon.  The remainder of the
colon is largely decompressed; the colon is unremarkable in
appearance.

The bladder is mildly distended and appears grossly unremarkable.
The uterus is within normal limits.  The ovaries are relatively
symmetric.  No suspicious adnexal masses are seen.  No inguinal
lymphadenopathy is seen.

No acute osseous abnormalities are identified.

Review of the MIP images confirms the above findings.
IMPRESSION: Unremarkable CT of the abdomen and pelvis.

## 2012-12-29 ENCOUNTER — Other Ambulatory Visit: Payer: Self-pay | Admitting: Obstetrics and Gynecology

## 2012-12-29 DIAGNOSIS — Z9889 Other specified postprocedural states: Secondary | ICD-10-CM

## 2012-12-29 DIAGNOSIS — N6325 Unspecified lump in the left breast, overlapping quadrants: Secondary | ICD-10-CM

## 2012-12-31 ENCOUNTER — Ambulatory Visit
Admission: RE | Admit: 2012-12-31 | Discharge: 2012-12-31 | Disposition: A | Discharge status: Home / Self Care | Payer: 59 | Source: Ambulatory Visit | Attending: Obstetrics and Gynecology | Admitting: Obstetrics and Gynecology

## 2012-12-31 ENCOUNTER — Other Ambulatory Visit: Payer: Self-pay | Admitting: Obstetrics and Gynecology

## 2012-12-31 DIAGNOSIS — N6325 Unspecified lump in the left breast, overlapping quadrants: Secondary | ICD-10-CM

## 2012-12-31 DIAGNOSIS — Z9889 Other specified postprocedural states: Secondary | ICD-10-CM

## 2013-01-04 ENCOUNTER — Other Ambulatory Visit: Payer: 59

## 2013-01-05 ENCOUNTER — Other Ambulatory Visit: Payer: 59

## 2013-01-11 ENCOUNTER — Other Ambulatory Visit: Payer: Self-pay | Admitting: Obstetrics and Gynecology

## 2013-01-11 DIAGNOSIS — Z9889 Other specified postprocedural states: Secondary | ICD-10-CM

## 2013-01-11 DIAGNOSIS — N6325 Unspecified lump in the left breast, overlapping quadrants: Secondary | ICD-10-CM

## 2013-01-13 ENCOUNTER — Ambulatory Visit
Admission: RE | Admit: 2013-01-13 | Discharge: 2013-01-13 | Disposition: A | Discharge status: Home / Self Care | Payer: 59 | Source: Ambulatory Visit | Attending: Obstetrics and Gynecology | Admitting: Obstetrics and Gynecology

## 2013-01-13 ENCOUNTER — Other Ambulatory Visit: Payer: Self-pay | Admitting: Obstetrics and Gynecology

## 2013-01-13 DIAGNOSIS — N6325 Unspecified lump in the left breast, overlapping quadrants: Secondary | ICD-10-CM

## 2013-01-13 DIAGNOSIS — Z9889 Other specified postprocedural states: Secondary | ICD-10-CM

## 2013-09-27 ENCOUNTER — Encounter (HOSPITAL_COMMUNITY): Payer: Self-pay | Admitting: Emergency Medicine

## 2013-09-27 ENCOUNTER — Emergency Department (HOSPITAL_COMMUNITY)
Admission: EM | Admit: 2013-09-27 | Discharge: 2013-09-27 | Disposition: A | Discharge status: Home / Self Care | Payer: 59 | Attending: Emergency Medicine | Admitting: Emergency Medicine

## 2013-09-27 ENCOUNTER — Emergency Department (HOSPITAL_COMMUNITY): Payer: 59

## 2013-09-27 DIAGNOSIS — Z8679 Personal history of other diseases of the circulatory system: Secondary | ICD-10-CM | POA: Insufficient documentation

## 2013-09-27 DIAGNOSIS — M542 Cervicalgia: Secondary | ICD-10-CM

## 2013-09-27 DIAGNOSIS — R51 Headache: Secondary | ICD-10-CM | POA: Insufficient documentation

## 2013-09-27 DIAGNOSIS — IMO0002 Reserved for concepts with insufficient information to code with codable children: Secondary | ICD-10-CM | POA: Insufficient documentation

## 2013-09-27 DIAGNOSIS — H538 Other visual disturbances: Secondary | ICD-10-CM | POA: Insufficient documentation

## 2013-09-27 DIAGNOSIS — R42 Dizziness and giddiness: Secondary | ICD-10-CM | POA: Insufficient documentation

## 2013-09-27 DIAGNOSIS — R059 Cough, unspecified: Secondary | ICD-10-CM | POA: Insufficient documentation

## 2013-09-27 DIAGNOSIS — R05 Cough: Secondary | ICD-10-CM | POA: Insufficient documentation

## 2013-09-27 DIAGNOSIS — Z79899 Other long term (current) drug therapy: Secondary | ICD-10-CM | POA: Insufficient documentation

## 2013-09-27 DIAGNOSIS — Z8614 Personal history of Methicillin resistant Staphylococcus aureus infection: Secondary | ICD-10-CM | POA: Insufficient documentation

## 2013-09-27 DIAGNOSIS — R509 Fever, unspecified: Secondary | ICD-10-CM | POA: Insufficient documentation

## 2013-09-27 DIAGNOSIS — Z872 Personal history of diseases of the skin and subcutaneous tissue: Secondary | ICD-10-CM | POA: Insufficient documentation

## 2013-09-27 DIAGNOSIS — Z3202 Encounter for pregnancy test, result negative: Secondary | ICD-10-CM | POA: Insufficient documentation

## 2013-09-27 DIAGNOSIS — R110 Nausea: Secondary | ICD-10-CM | POA: Insufficient documentation

## 2013-09-27 DIAGNOSIS — M083 Juvenile rheumatoid polyarthritis (seronegative): Secondary | ICD-10-CM | POA: Insufficient documentation

## 2013-09-27 DIAGNOSIS — R519 Headache, unspecified: Secondary | ICD-10-CM

## 2013-09-27 DIAGNOSIS — K219 Gastro-esophageal reflux disease without esophagitis: Secondary | ICD-10-CM | POA: Insufficient documentation

## 2013-09-27 DIAGNOSIS — J3489 Other specified disorders of nose and nasal sinuses: Secondary | ICD-10-CM | POA: Insufficient documentation

## 2013-09-27 LAB — CBC WITH DIFFERENTIAL/PLATELET
Basophils Absolute: 0 10*3/uL (ref 0.0–0.1)
Basophils Relative: 1 % (ref 0–1)
Eosinophils Absolute: 0 10*3/uL (ref 0.0–0.7)
Eosinophils Relative: 1 % (ref 0–5)
HCT: 40.3 % (ref 36.0–46.0)
Hemoglobin: 14.3 g/dL (ref 12.0–15.0)
Lymphocytes Relative: 16 % (ref 12–46)
Lymphs Abs: 0.5 10*3/uL — ABNORMAL LOW (ref 0.7–4.0)
MCH: 34 pg (ref 26.0–34.0)
MCHC: 35.5 g/dL (ref 30.0–36.0)
MCV: 96 fL (ref 78.0–100.0)
Monocytes Absolute: 0.2 10*3/uL (ref 0.1–1.0)
Monocytes Relative: 6 % (ref 3–12)
Neutro Abs: 2.4 10*3/uL (ref 1.7–7.7)
Neutrophils Relative %: 77 % (ref 43–77)
Platelets: 222 10*3/uL (ref 150–400)
RBC: 4.2 MIL/uL (ref 3.87–5.11)
RDW: 13.8 % (ref 11.5–15.5)
WBC: 3.2 10*3/uL — ABNORMAL LOW (ref 4.0–10.5)

## 2013-09-27 LAB — CSF CELL COUNT WITH DIFFERENTIAL
RBC Count, CSF: 1 /mm3 — ABNORMAL HIGH
Tube #: 3
WBC, CSF: 0 /mm3 (ref 0–5)

## 2013-09-27 LAB — URINALYSIS, ROUTINE W REFLEX MICROSCOPIC
Bilirubin Urine: NEGATIVE
Glucose, UA: NEGATIVE mg/dL
Ketones, ur: 40 mg/dL — AB
Leukocytes, UA: NEGATIVE
Nitrite: NEGATIVE
Protein, ur: NEGATIVE mg/dL
Specific Gravity, Urine: 1.01 (ref 1.005–1.030)
Urobilinogen, UA: 0.2 mg/dL (ref 0.0–1.0)
pH: 6.5 (ref 5.0–8.0)

## 2013-09-27 LAB — GRAM STAIN

## 2013-09-27 LAB — URINE MICROSCOPIC-ADD ON

## 2013-09-27 LAB — MONONUCLEOSIS SCREEN: Mono Screen: NEGATIVE

## 2013-09-27 LAB — GLUCOSE, CSF: Glucose, CSF: 52 mg/dL (ref 43–76)

## 2013-09-27 LAB — SEDIMENTATION RATE: Sed Rate: 4 mm/hr (ref 0–22)

## 2013-09-27 LAB — PROTEIN, CSF: Total  Protein, CSF: 44 mg/dL (ref 15–45)

## 2013-09-27 LAB — PREGNANCY, URINE: Preg Test, Ur: NEGATIVE

## 2013-09-27 MED ORDER — MORPHINE SULFATE 4 MG/ML IJ SOLN
4.0000 mg | Freq: Once | INTRAMUSCULAR | Status: AC
  Administered 2013-09-27: 4 mg via INTRAVENOUS
  Filled 2013-09-27: qty 1

## 2013-09-27 MED ORDER — ONDANSETRON 4 MG PO TBDP: ORAL_TABLET | ORAL | Status: DC

## 2013-09-27 MED ORDER — SODIUM CHLORIDE 0.9 % IV BOLUS (SEPSIS)
1000.0000 mL | Freq: Once | INTRAVENOUS | Status: AC
  Administered 2013-09-27: 1000 mL via INTRAVENOUS

## 2013-09-27 MED ORDER — ONDANSETRON HCL 4 MG/2ML IJ SOLN
4.0000 mg | Freq: Once | INTRAMUSCULAR | Status: AC
  Administered 2013-09-27: 4 mg via INTRAVENOUS
  Filled 2013-09-27: qty 2

## 2013-09-27 NOTE — ED Notes (Signed)
Patient given ginger ale. 

## 2013-09-27 NOTE — ED Notes (Signed)
Phlebotomy at bedside to draw Lyme Disease.

## 2013-09-27 NOTE — ED Notes (Signed)
Spoke with lab about Lyme disease blood. Reporting they already received the specimen and it is running.

## 2013-09-27 NOTE — ED Provider Notes (Signed)
CSN: [id number]     Arrival date & time 09/27/13  1139 History   First MD Initiated Contact with Patient 09/27/13 1148     Chief Complaint  Patient presents with  . Neck Pain  . Fever   (Consider location/radiation/quality/duration/timing/severity/associated sxs/prior Treatment) HPI Comments: 22 yo female with lupus, arthritis, fibromyalgia, msa hx presents with headache, neck pain, joint pains the past month.  The past month she has had worsening fatigue, joint pain, generalized intermittent HA that are gradual onset.  She was evaluated by her [facility] Dr Friday and had blood work that was okay per pt.  Last night developed neck pain and stiffness worse with movement.  Pt taking her lupus meds/ steroids/ imuran as directed.  No travel or known rashes/ tic bites.  No sick contacts.  No end organ issues with lupus per pt (ie cerebritis, pericarditis).   Nothing improves sx.   Patient is a 22 y.o. female presenting with neck pain and fever. The history is provided by the patient and a parent.  Neck Pain Associated symptoms: fever and headaches   Associated symptoms: no chest pain   Fever Associated symptoms: congestion, cough, headaches and nausea   Associated symptoms: no chest pain, no chills, no dysuria, no rash and no vomiting     Past Medical History  Diagnosis Date  . IBS (irritable bowel syndrome)   . JRA (juvenile rheumatoid arthritis)   . Lupus (systemic lupus erythematosus)   . Fibromyalgia   . GERD (gastroesophageal reflux disease)     since birth  . History of migraine headaches   . Personal history of methicillin resistant Staphylococcus aureus   . Unspecified sinusitis (chronic) 08/13/11  . Diarrhea 08/13/11  . Juvenile rheumatoid arthritis   . Chills with fever   . Weight loss, unintentional   . Visual disturbances     eyes getting weaker and blurred vision   . Cough   . Wheezing   . Chest pain   . Abdominal distention   . Abdominal pain   . Diarrhea   . Nausea  and vomiting   . Stress headaches   . Migraines   . Weakness generalized   . Fatigue   . Skin abscess     breast    Past Surgical History  Procedure Laterality Date  . Breast reduction surgery    . Breast surgery  July 2010    breast reduction   . Breast surgery  December 2010    fix breast reduction   . Tonsillectomy and adenoidectomy  age 29   History reviewed. No pertinent family history. History  Substance Use Topics  . Smoking status: Never Smoker   . Smokeless tobacco: Never Used  . Alcohol Use: No   OB History   Grav Para Term Preterm Abortions TAB SAB Ect Mult Living                 Review of Systems  Constitutional: Positive for fever. Negative for chills.  HENT: Positive for congestion.   Eyes: Positive for visual disturbance (blurry bilateral ).  Respiratory: Positive for cough. Negative for shortness of breath.   Cardiovascular: Negative for chest pain.  Gastrointestinal: Positive for nausea. Negative for vomiting and abdominal pain.  Genitourinary: Negative for dysuria and flank pain.  Musculoskeletal: Positive for arthralgias and neck pain. Negative for back pain and neck stiffness.  Skin: Negative for rash.  Neurological: Positive for light-headedness and headaches.    Allergies  Amoxicillin and Compazine  Home Medications   Current Outpatient Rx  Name  Route  Sig  Dispense  Refill  . ALPRAZolam (XANAX) 1 MG tablet   Oral   Take 1 mg by mouth 2 (two) times daily.           Marland Kitchen azaTHIOprine (IMURAN) 50 MG tablet   Oral   Take 75 mg by mouth daily. Pt taking 75 mg         . Norethindrone Acetate-Ethinyl Estrad-FE (LOMEDIA 24 FE) 1-20 MG-MCG(24) tablet   Oral   Take 1 tablet by mouth daily.         . pantoprazole (PROTONIX) 40 MG tablet   Oral   Take 40 mg by mouth 2 (two) times daily.           . predniSONE (DELTASONE) 1 MG tablet   Oral   Take 3 mg by mouth daily.          BP 127/103  Pulse 110  Temp(Src) 98 F (36.7 C)  (Oral)  Resp 18  Ht 5\' 1"  (1.549 m)  Wt 107 lb (48.535 kg)  BMI 20.23 kg/m2  SpO2 95%  LMP 09/01/2013 Physical Exam  Nursing note and vitals reviewed. Constitutional: She is oriented to person, place, and time. She appears well-developed and well-nourished.  HENT:  Head: Normocephalic and atraumatic.  Eyes: Conjunctivae are normal. Right eye exhibits no discharge. Left eye exhibits no discharge.  Neck: Normal range of motion. Neck supple. No tracheal deviation present.  Cardiovascular: Normal rate and regular rhythm.   Pulmonary/Chest: Effort normal and breath sounds normal.  Abdominal: Soft. She exhibits no distension. There is no tenderness. There is no guarding.  Musculoskeletal: She exhibits tenderness (paraspinal bilateral cervical, full rom with pain, no pain with flexion of hips/ legs, pt can touch chin to chest). She exhibits no edema.  Neurological: She is alert and oriented to person, place, and time.  5+ strength in UE and LE with f/e at major joints. Sensation to palpation intact in UE and LE. CNs 2-12 grossly intact.  EOMFI.  PERRL.   Finger nose and coordination intact bilateral.   Visual fields intact to finger testing.   Skin: Skin is warm. No rash noted.  Psychiatric: She has a normal mood and affect.    ED Course  LUMBAR PUNCTURE Date/Time: 09/27/2013 5:24 PM Performed by: Enid Skeens Authorized by: Enid Skeens Consent: Verbal consent obtained. written consent obtained. Consent given by: patient Patient identity confirmed: verbally with patient Indications: evaluation for infection Anesthesia: local infiltration Local anesthetic: lidocaine 1% without epinephrine Patient sedated: no Lumbar space: L3-L4 interspace Patient's position: sitting Needle gauge: 20 Needle length: 2.5 in Number of attempts: 1 Fluid appearance: clear Tubes of fluid: 4 Total volume: 7 ml Post-procedure: site cleaned and pressure dressing applied   (including critical  care time) Labs Review Labs Reviewed  CBC WITH DIFFERENTIAL - Abnormal; Notable for the following:    WBC 3.2 (*)    Lymphs Abs 0.5 (*)    All other components within normal limits  URINALYSIS, ROUTINE W REFLEX MICROSCOPIC - Abnormal; Notable for the following:    Hgb urine dipstick SMALL (*)    Ketones, ur 40 (*)    All other components within normal limits  CSF CELL COUNT WITH DIFFERENTIAL - Abnormal; Notable for the following:    Appearance, CSF CLEAR (*)    RBC Count, CSF 1 (*)    All other components within normal limits  URINE MICROSCOPIC-ADD ON -  Abnormal; Notable for the following:    Squamous Epithelial / LPF FEW (*)    All other components within normal limits  GRAM STAIN  CSF CULTURE  MONONUCLEOSIS SCREEN  SEDIMENTATION RATE  PREGNANCY, URINE  PROTEIN, CSF  GLUCOSE, CSF  LYME DISEASE DNA BY PCR(BORRELIA BURG)  B. BURGDORFI ANTIBODIES, CSF  HERPES SIMPLEX VIRUS(HSV) DNA BY PCR   Imaging Review Ct Head Wo Contrast  09/27/2013   CLINICAL DATA:  Intermittent fever. Lupus.  EXAM: CT HEAD WITHOUT CONTRAST  TECHNIQUE: Contiguous axial images were obtained from the base of the skull through the vertex without contrast.  COMPARISON:  Brain MRI 04/17/2011  FINDINGS: Normal appearance of the intracranial structures. No evidence for acute hemorrhage, mass lesion, midline shift, hydrocephalus or large infarct. No acute bony abnormality. The visualized sinuses are clear.  IMPRESSION: No acute intracranial abnormality.   Electronically Signed   By: Richarda Overlie M.D.   On: 09/27/2013 13:24    EKG Interpretation   None       MDM  No diagnosis found. Concern for lupus cerebritis vs viral syndrome vs viral meningitis. Spoke with fellow at [facility], agreed with LP.  Discussed r/ b of LP with pt, agrees.  Pain medicines given in ED.  CT head no acute findings, reviewed.  Discussed r/b with father and pt again, agree to LP.  LP done, one attempt, no complications. Awaiting results.   Pt has outpt fup. Pt vomiting prior to discharge, zofran given, plan for reassessment.  Results and differential diagnosis were discussed with the patient. Close follow up outpatient was discussed, patient comfortable with the plan.  Fup CSF results outpt.   Diagnosis: Headache, Neck pain, SLE      Enid Skeens, MD 09/27/13 [phone]

## 2013-09-27 NOTE — ED Notes (Signed)
Pt reports that she has had intermittent fever and nausea/ loss of appetite over the past couple of months. Reports that last night she developed a stiffness in her neck and night sweats. Reports a hx of lupus

## 2013-09-28 LAB — HERPES SIMPLEX VIRUS(HSV) DNA BY PCR
HSV 1 DNA: NOT DETECTED
HSV 2 DNA: NOT DETECTED

## 2013-10-01 LAB — CSF CULTURE W GRAM STAIN: Culture: NO GROWTH

## 2013-10-01 LAB — CSF CULTURE

## 2013-10-14 LAB — B. BURGDORFI ANTIBODIES, CSF: Lyme Ab: NEGATIVE

## 2013-11-11 ENCOUNTER — Encounter: Payer: Self-pay | Admitting: Neurology

## 2013-11-14 ENCOUNTER — Encounter: Payer: Self-pay | Admitting: Neurology

## 2013-11-14 ENCOUNTER — Encounter (INDEPENDENT_AMBULATORY_CARE_PROVIDER_SITE_OTHER): Payer: Self-pay

## 2013-11-14 ENCOUNTER — Ambulatory Visit (INDEPENDENT_AMBULATORY_CARE_PROVIDER_SITE_OTHER): Payer: 59 | Admitting: Neurology

## 2013-11-14 VITALS — BP 109/85 | HR 99 | Ht 60.5 in | Wt 106.5 lb

## 2013-11-14 DIAGNOSIS — A4902 Methicillin resistant Staphylococcus aureus infection, unspecified site: Secondary | ICD-10-CM

## 2013-11-14 DIAGNOSIS — G43909 Migraine, unspecified, not intractable, without status migrainosus: Secondary | ICD-10-CM

## 2013-11-14 DIAGNOSIS — M329 Systemic lupus erythematosus, unspecified: Secondary | ICD-10-CM

## 2013-11-14 MED ORDER — RIZATRIPTAN BENZOATE 5 MG PO TBDP: 5.0000 mg | ORAL_TABLET | ORAL | Status: DC | PRN

## 2013-11-14 MED ORDER — TOPIRAMATE 25 MG PO TABS: ORAL_TABLET | ORAL | Status: DC

## 2013-11-14 NOTE — Progress Notes (Signed)
[facility]  PATIENT: Emily Reed DOB: [birth year]  HISTORICAL  Sharrell is a 22 years old right-handed Caucasian female, accompanied by her mother, referred by her primary care physician Dr. Juluis Rainier for evaluation of migraine headache  She had past medical history of depression anxiety, on long-term Xanax treatment, also had past medical history of lupus, rheumatoid arthritis, irritable bowel syndrome, is taking long-term Immuran 75 mg every day, prednisone 5 mg every day,  She reported a long-standing history of migraine, daily pressure headaches, sometimes exacerbated to a much severe retro-orbital area pounding headache with associated light noise sensitivity, nauseous, in the past 6 months, she has headache almost daily basis, which has improved, with a recent increase of her prednisone dosage, she has been taking ibuprofen 3-4 times each week, also baclofen 10 mg every day with limited help,  Trigger for her migraine are stress, she is a third grade teacher. She tried atenolol in the past, without help.  She denies visual loss, complains of generalized fatigue, for her depression, anxiety, she has tried different SSRIs in the past, with limited help, she is currently taking Xanax, 1 mg twice a day, this was decreased from 1 mg 4 times a day,  REVIEW OF SYSTEMS: Full 14 system review of systems performed and notable only for fever, chills fatigue, chest pain, palpitation, ringing in ears, spinning sensation, itching, blurred vision, double vision, loss of vision, eye pain, shortness of breath, lymph nodes, feeling hot, increased thirst, flushing, achy muscles, allergies, memory loss, confusion, headaches, weakness, slurred speech, sleepiness, dizziness, depression, anxiety, much sleep, decreased energy, change in appetite, disinterested in activities,  ALLERGIES: Allergies  Allergen Reactions  . Amoxicillin Nausea Only  . Compazine [Prochlorperazine] Other  (See Comments)    Makes me crazy    HOME MEDICATIONS: Outpatient Prescriptions Prior to Visit  Medication Sig Dispense Refill  . ALPRAZolam (XANAX) 1 MG tablet Take 1 mg by mouth 2 (two) times daily.        Marland Kitchen azaTHIOprine (IMURAN) 50 MG tablet Take 75 mg by mouth daily. Pt taking 75 mg      . baclofen (LIORESAL) 10 MG tablet Take 10 mg by mouth 3 (three) times daily as needed for muscle spasms.      . cholecalciferol (VITAMIN D) 1000 UNITS tablet Take 1,000 Units by mouth daily.      . Norethindrone Acetate-Ethinyl Estrad-FE (LOMEDIA 24 FE) 1-20 MG-MCG(24) tablet Take 1 tablet by mouth daily.      . ondansetron (ZOFRAN ODT) 4 MG disintegrating tablet 4mg  ODT q4 hours prn nausea/vomit  10 tablet  0  . pantoprazole (PROTONIX) 40 MG tablet Take 40 mg by mouth 2 (two) times daily.        . predniSONE (DELTASONE) 1 MG tablet Take 3 mg by mouth daily.      . Probiotic Product (PROBIOTIC DAILY) CAPS Take by mouth. Once or twice a day       No facility-administered medications prior to visit.    PAST MEDICAL HISTORY: Past Medical History  Diagnosis Date  . IBS (irritable bowel syndrome)   . JRA (juvenile rheumatoid arthritis)   . Lupus (systemic lupus erythematosus)   . Fibromyalgia   . GERD (gastroesophageal reflux disease)     since birth  . History of migraine headaches   . Personal history of methicillin resistant Staphylococcus aureus   . Unspecified sinusitis (chronic) 08/13/11  . Diarrhea 08/13/11  . Juvenile rheumatoid arthritis   . Chills  with fever   . Weight loss, unintentional   . Visual disturbances     eyes getting weaker and blurred vision   . Cough   . Wheezing   . Chest pain   . Abdominal distention   . Abdominal pain   . Diarrhea   . Nausea and vomiting   . Stress headaches   . Migraines   . Weakness generalized   . Fatigue   . Skin abscess     breast     PAST SURGICAL HISTORY: Past Surgical History  Procedure Laterality Date  . Breast reduction surgery     . Breast surgery  July 2010    breast reduction   . Breast surgery  December 2010    fix breast reduction   . Tonsillectomy and adenoidectomy  age 61    FAMILY HISTORY: No family history on file.  SOCIAL HISTORY:  History   Social History  . Marital Status: Single    Spouse Name: N/A    Number of Children: 0  . Years of Education: 16   Occupational History  .     Social History Main Topics  . Smoking status: Never Smoker   . Smokeless tobacco: Never Used  . Alcohol Use: Yes     Comment: occas.  . Drug Use: No  . Sexual Activity: Not on file   Other Topics Concern  . Not on file   Social History Narrative   Patient is single.   Patient is a Tourist information centre manager.   Patient does drink caffeine- one cup of coffee and one cup of soda daily.     PHYSICAL EXAM   Filed Vitals:   11/14/13 1510  BP: 109/85  Pulse: 99  Height: 5' 0.5" (1.537 m)  Weight: 106 lb 8 oz (48.308 kg)    Not recorded    Body mass index is 20.45 kg/(m^2).   Generalized: In no acute distress  Neck: Supple, no carotid bruits   Cardiac: Regular rate rhythm  Pulmonary: Clear to auscultation bilaterally  Musculoskeletal: No deformity  Neurological examination  Mentation: Alert oriented to time, place, history taking, and causual conversation  Cranial nerve II-XII: Pupils were equal round reactive to light extraocular movements were full, Visual field were full on confrontational test. Bilateral fundi were sharp.  Facial sensation and strength were normal. Hearing was intact to finger rubbing bilaterally. Uvula tongue midline.  head turning and shoulder shrug and were normal and symmetric.Tongue protrusion into cheek strength was normal.  Motor: normal tone, bulk and strength.  Sensory: Intact to fine touch, pinprick, preserved vibratory sensation, and proprioception at toes.  Coordination: Normal finger to nose, heel-to-shin bilaterally there was no truncal ataxia  Gait:  Rising up from seated position without assistance, normal stance, without trunk ataxia, moderate stride, good arm swing, smooth turning, able to perform tiptoe, and heel walking without difficulty.   Romberg signs: Negative  Deep tendon reflexes: Brachioradialis 2/2, biceps 2/2, triceps 2/2, patellar 2/2, Achilles 2/2, plantar responses were flexor bilaterally.   DIAGNOSTIC DATA (LABS, IMAGING, TESTING) - I reviewed patient records, labs, notes, testing and imaging myself where available.  Lab Results  Component Value Date   WBC 3.2* 09/27/2013   HGB 14.3 09/27/2013   HCT 40.3 09/27/2013   MCV 96.0 09/27/2013   PLT 222 09/27/2013      Component Value Date/Time   NA 138 05/18/2012 1528   K 3.7 05/18/2012 1528   CL 103 05/18/2012 1528   CO2  21 05/18/2012 1528   GLUCOSE 116* 05/18/2012 1528   BUN 8 05/18/2012 1528   CREATININE 0.55 05/18/2012 1528   CREATININE 0.79 09/01/2011 1411   CALCIUM 10.0 05/18/2012 1528   PROT 7.3 09/01/2011 1411   ALBUMIN 4.6 09/01/2011 1411   AST 14 09/01/2011 1411   ALT 21 09/01/2011 1411   ALKPHOS 33* 09/01/2011 1411   BILITOT 0.7 09/01/2011 1411   GFRNONAA >90 05/18/2012 1528   GFRAA >90 05/18/2012 1528    Lab Results  Component Value Date   TSH 1.362  11/13/2007     ASSESSMENT AND PLAN   22 years old Caucasian female, with past medical history of lupus, depression, anxiety, complains of frequent migraine headaches, normal neurological examinations,  1. With her complicated past medical history, we will proceed with MRI of the brain with without contrast to rule out central nervous system involvement of lupus    2. Topamax 25 mg, titrating to 2 tablets twice a day as a preventive measure 3.  Maxalt as needed for abortive treatment 4.  return to clinic in 3  Months with Gerlene Fee, M.D. Ph.D.  [facility] [address], [address] [place], [place] [address] [phone]

## 2013-11-17 ENCOUNTER — Telehealth: Payer: Self-pay | Admitting: Neurology

## 2013-11-17 NOTE — Telephone Encounter (Signed)
Please advise 

## 2013-11-28 ENCOUNTER — Other Ambulatory Visit: Payer: 59

## 2013-11-29 ENCOUNTER — Ambulatory Visit
Admission: RE | Admit: 2013-11-29 | Discharge: 2013-11-29 | Disposition: A | Discharge status: Home / Self Care | Payer: 59 | Source: Ambulatory Visit | Attending: Neurology | Admitting: Neurology

## 2013-11-29 DIAGNOSIS — M329 Systemic lupus erythematosus, unspecified: Secondary | ICD-10-CM

## 2013-11-29 DIAGNOSIS — R51 Headache: Secondary | ICD-10-CM

## 2013-11-29 DIAGNOSIS — A4902 Methicillin resistant Staphylococcus aureus infection, unspecified site: Secondary | ICD-10-CM

## 2013-11-29 DIAGNOSIS — G43909 Migraine, unspecified, not intractable, without status migrainosus: Secondary | ICD-10-CM

## 2013-11-29 MED ORDER — GADOBENATE DIMEGLUMINE 529 MG/ML IV SOLN
9.0000 mL | Freq: Once | INTRAVENOUS | Status: AC | PRN
  Administered 2013-11-29: 9 mL via INTRAVENOUS

## 2013-11-30 ENCOUNTER — Telehealth: Payer: Self-pay | Admitting: Neurology

## 2013-11-30 NOTE — Telephone Encounter (Signed)
Mother states patient is having a severe HA. Patient had MRI done on yesterday, came home went to sleep. She woke up vomiting around midnight. She is unable to function because of pain. Mother states she thinks patient is at a 10 on pain scale because she is unable to function. Advised mother to have patient go to urgent care/ER if pain that severe. She refused, says it will take to long. She says meds Topiramate 25mg  and Maxalt 5mg  is not working. Requesting a call back from [facility] asap for advice or Rx on new meds.

## 2013-11-30 NOTE — Telephone Encounter (Signed)
Mother was very upset . She stated she will not take patient to the ER and she thinks that is absolutely cruel to suggest she goes. She says she will give the patient some Oxycodone instead, then hung up.

## 2013-11-30 NOTE — Telephone Encounter (Signed)
Please send to ER !!! Did we do the MRI and is there a report?

## 2013-12-09 ENCOUNTER — Emergency Department (HOSPITAL_COMMUNITY): Payer: BC Managed Care – PPO

## 2013-12-09 ENCOUNTER — Encounter (HOSPITAL_COMMUNITY): Payer: Self-pay | Admitting: Emergency Medicine

## 2013-12-09 ENCOUNTER — Emergency Department (HOSPITAL_COMMUNITY)
Admission: EM | Admit: 2013-12-09 | Discharge: 2013-12-09 | Disposition: A | Discharge status: Home / Self Care | Payer: BC Managed Care – PPO | Attending: Emergency Medicine | Admitting: Emergency Medicine

## 2013-12-09 DIAGNOSIS — M329 Systemic lupus erythematosus, unspecified: Secondary | ICD-10-CM | POA: Insufficient documentation

## 2013-12-09 DIAGNOSIS — M083 Juvenile rheumatoid polyarthritis (seronegative): Secondary | ICD-10-CM | POA: Insufficient documentation

## 2013-12-09 DIAGNOSIS — R197 Diarrhea, unspecified: Secondary | ICD-10-CM | POA: Insufficient documentation

## 2013-12-09 DIAGNOSIS — IMO0001 Reserved for inherently not codable concepts without codable children: Secondary | ICD-10-CM | POA: Insufficient documentation

## 2013-12-09 DIAGNOSIS — R61 Generalized hyperhidrosis: Secondary | ICD-10-CM | POA: Insufficient documentation

## 2013-12-09 DIAGNOSIS — Z880 Allergy status to penicillin: Secondary | ICD-10-CM | POA: Insufficient documentation

## 2013-12-09 DIAGNOSIS — Z3202 Encounter for pregnancy test, result negative: Secondary | ICD-10-CM | POA: Insufficient documentation

## 2013-12-09 DIAGNOSIS — R509 Fever, unspecified: Secondary | ICD-10-CM | POA: Insufficient documentation

## 2013-12-09 DIAGNOSIS — Z8669 Personal history of other diseases of the nervous system and sense organs: Secondary | ICD-10-CM | POA: Insufficient documentation

## 2013-12-09 DIAGNOSIS — Z8614 Personal history of Methicillin resistant Staphylococcus aureus infection: Secondary | ICD-10-CM | POA: Insufficient documentation

## 2013-12-09 DIAGNOSIS — R1084 Generalized abdominal pain: Secondary | ICD-10-CM | POA: Insufficient documentation

## 2013-12-09 DIAGNOSIS — IMO0002 Reserved for concepts with insufficient information to code with codable children: Secondary | ICD-10-CM | POA: Insufficient documentation

## 2013-12-09 DIAGNOSIS — K219 Gastro-esophageal reflux disease without esophagitis: Secondary | ICD-10-CM | POA: Insufficient documentation

## 2013-12-09 DIAGNOSIS — Z79899 Other long term (current) drug therapy: Secondary | ICD-10-CM | POA: Insufficient documentation

## 2013-12-09 DIAGNOSIS — K551 Chronic vascular disorders of intestine: Secondary | ICD-10-CM

## 2013-12-09 DIAGNOSIS — R112 Nausea with vomiting, unspecified: Secondary | ICD-10-CM | POA: Insufficient documentation

## 2013-12-09 DIAGNOSIS — R109 Unspecified abdominal pain: Secondary | ICD-10-CM

## 2013-12-09 DIAGNOSIS — Z872 Personal history of diseases of the skin and subcutaneous tissue: Secondary | ICD-10-CM | POA: Insufficient documentation

## 2013-12-09 DIAGNOSIS — G43909 Migraine, unspecified, not intractable, without status migrainosus: Secondary | ICD-10-CM | POA: Insufficient documentation

## 2013-12-09 LAB — CBC WITH DIFFERENTIAL/PLATELET
Basophils Absolute: 0 10*3/uL (ref 0.0–0.1)
Basophils Relative: 0 % (ref 0–1)
Eosinophils Absolute: 0 10*3/uL (ref 0.0–0.7)
Eosinophils Relative: 0 % (ref 0–5)
HCT: 41.9 % (ref 36.0–46.0)
Hemoglobin: 14.9 g/dL (ref 12.0–15.0)
Lymphocytes Relative: 5 % — ABNORMAL LOW (ref 12–46)
Lymphs Abs: 0.2 10*3/uL — ABNORMAL LOW (ref 0.7–4.0)
MCH: 33.2 pg (ref 26.0–34.0)
MCHC: 35.6 g/dL (ref 30.0–36.0)
MCV: 93.3 fL (ref 78.0–100.0)
Monocytes Absolute: 0.1 10*3/uL (ref 0.1–1.0)
Monocytes Relative: 2 % — ABNORMAL LOW (ref 3–12)
Neutro Abs: 4.3 10*3/uL (ref 1.7–7.7)
Neutrophils Relative %: 93 % — ABNORMAL HIGH (ref 43–77)
Platelets: 291 10*3/uL (ref 150–400)
RBC: 4.49 MIL/uL (ref 3.87–5.11)
RDW: 13.7 % (ref 11.5–15.5)
WBC: 4.6 10*3/uL (ref 4.0–10.5)

## 2013-12-09 LAB — COMPREHENSIVE METABOLIC PANEL
ALT: 7 U/L (ref 0–35)
AST: 14 U/L (ref 0–37)
Albumin: 4.5 g/dL (ref 3.5–5.2)
Alkaline Phosphatase: 46 U/L (ref 39–117)
BUN: 12 mg/dL (ref 6–23)
CO2: 19 mEq/L (ref 19–32)
Calcium: 9.5 mg/dL (ref 8.4–10.5)
Chloride: 104 mEq/L (ref 96–112)
Creatinine, Ser: 0.69 mg/dL (ref 0.50–1.10)
GFR calc Af Amer: >90 mL/min (ref >90)
GFR calc non Af Amer: >90 mL/min (ref >90)
Glucose, Bld: 133 mg/dL — ABNORMAL HIGH (ref 70–99)
Potassium: 3.5 mEq/L — ABNORMAL LOW (ref 3.7–5.3)
Sodium: 139 mEq/L (ref 137–147)
Total Bilirubin: 0.4 mg/dL (ref 0.3–1.2)
Total Protein: 7.3 g/dL (ref 6.0–8.3)

## 2013-12-09 LAB — URINALYSIS, ROUTINE W REFLEX MICROSCOPIC
Bilirubin Urine: NEGATIVE
Glucose, UA: NEGATIVE mg/dL
Hgb urine dipstick: NEGATIVE
Ketones, ur: 15 mg/dL — AB
Nitrite: NEGATIVE
Protein, ur: NEGATIVE mg/dL
Specific Gravity, Urine: 1.011 (ref 1.005–1.030)
Urobilinogen, UA: 0.2 mg/dL (ref 0.0–1.0)
pH: 7.5 (ref 5.0–8.0)

## 2013-12-09 LAB — LIPASE, BLOOD: Lipase: 30 U/L (ref 11–59)

## 2013-12-09 LAB — URINE MICROSCOPIC-ADD ON

## 2013-12-09 LAB — POCT PREGNANCY, URINE: Preg Test, Ur: NEGATIVE

## 2013-12-09 MED ORDER — OXYCODONE-ACETAMINOPHEN 5-325 MG PO TABS: 1.0000 | ORAL_TABLET | ORAL | Status: AC | PRN

## 2013-12-09 MED ORDER — SODIUM CHLORIDE 0.9 % IV BOLUS (SEPSIS)
1000.0000 mL | Freq: Once | INTRAVENOUS | Status: AC
  Administered 2013-12-09: 1000 mL via INTRAVENOUS

## 2013-12-09 MED ORDER — IOHEXOL 300 MG/ML  SOLN
100.0000 mL | Freq: Once | INTRAMUSCULAR | Status: AC | PRN
  Administered 2013-12-09: 80 mL via INTRAVENOUS

## 2013-12-09 MED ORDER — PROMETHAZINE HCL 25 MG/ML IJ SOLN: 12.5000 mg | Freq: Once | INTRAMUSCULAR | Status: DC

## 2013-12-09 MED ORDER — IOHEXOL 300 MG/ML  SOLN
50.0000 mL | Freq: Once | INTRAMUSCULAR | Status: AC | PRN
Start: 2013-12-09 — End: 2013-12-09
  Administered 2013-12-09: 50 mL via ORAL

## 2013-12-09 MED ORDER — ONDANSETRON HCL 4 MG/2ML IJ SOLN
4.0000 mg | Freq: Once | INTRAMUSCULAR | Status: AC
  Administered 2013-12-09: 4 mg via INTRAVENOUS
  Filled 2013-12-09: qty 2

## 2013-12-09 MED ORDER — HYDROMORPHONE HCL PF 1 MG/ML IJ SOLN
1.0000 mg | Freq: Once | INTRAMUSCULAR | Status: AC
  Administered 2013-12-09: 1 mg via INTRAVENOUS
  Filled 2013-12-09: qty 1

## 2013-12-09 MED ORDER — SODIUM CHLORIDE 0.9 % IV BOLUS (SEPSIS): 1000.0000 mL | Freq: Once | INTRAVENOUS | Status: DC

## 2013-12-09 MED ORDER — PROMETHAZINE HCL 25 MG RE SUPP: 25.0000 mg | Freq: Four times a day (QID) | RECTAL | Status: DC | PRN

## 2013-12-09 MED ORDER — PROMETHAZINE HCL 25 MG/ML IJ SOLN
12.5000 mg | Freq: Once | INTRAMUSCULAR | Status: DC
  Filled 2013-12-09: qty 1

## 2013-12-09 MED ORDER — SODIUM CHLORIDE 0.9 % IV SOLN
12.5000 mg | Freq: Once | INTRAVENOUS | Status: AC
  Administered 2013-12-09: 12.5 mg via INTRAVENOUS
  Filled 2013-12-09: qty 0.5

## 2013-12-09 MED ORDER — PANTOPRAZOLE SODIUM 40 MG IV SOLR
40.0000 mg | Freq: Once | INTRAVENOUS | Status: AC
  Administered 2013-12-09: 40 mg via INTRAVENOUS
  Filled 2013-12-09: qty 40

## 2013-12-09 MED ORDER — POTASSIUM CHLORIDE 10 MEQ/100ML IV SOLN
10.0000 meq | Freq: Once | INTRAVENOUS | Status: AC
  Administered 2013-12-09: 10 meq via INTRAVENOUS
  Filled 2013-12-09: qty 100

## 2013-12-09 MED ORDER — PROMETHAZINE HCL 25 MG PO TABS: 25.0000 mg | ORAL_TABLET | Freq: Four times a day (QID) | ORAL | Status: DC | PRN

## 2013-12-09 NOTE — ED Provider Notes (Signed)
CSN: [id number]     Arrival date & time 12/09/13  1431 History   First MD Initiated Contact with Patient 12/09/13 1505     Chief Complaint  Patient presents with  . Abdominal Pain   (Consider location/radiation/quality/duration/timing/severity/associated sxs/prior Treatment) The history is provided by the patient and a parent.   Patient with hx lupus, IBS, gastric ulcer, migraines, cyclic vomiting syndrome p/w severe abdominal pain located two inches above her umbilicus, gnawing and tearing in nature, constant x 3 days occuring just after vomiting blood, estimated 1 cup.  Has been taking protonix and carafate without improvement.  Is having night sweats and chills, subjective fevers, but notes all thermometers measure her temp at 97 or 98.  Has had normal bowel movement this morning followed by one episode of dark black diarrhea.  Pain is 6/10 at its best, when she is curled over.  And severe when she stands straight up, lies flat, rides in the car, etc.  States she called her GI doctor's office, Dr Randa Evens [facility]) and was directed to come to the ED.  No hx abdominal surgeries.    Past Medical History  Diagnosis Date  . IBS (irritable bowel syndrome)   . JRA (juvenile rheumatoid arthritis)   . Lupus (systemic lupus erythematosus)   . Fibromyalgia   . GERD (gastroesophageal reflux disease)     since birth  . History of migraine headaches   . Personal history of methicillin resistant Staphylococcus aureus   . Unspecified sinusitis (chronic) 08/13/11  . Diarrhea 08/13/11  . Juvenile rheumatoid arthritis   . Chills with fever   . Weight loss, unintentional   . Visual disturbances     eyes getting weaker and blurred vision   . Cough   . Wheezing   . Chest pain   . Abdominal distention   . Abdominal pain   . Diarrhea   . Nausea and vomiting   . Stress headaches   . Migraines   . Weakness generalized   . Fatigue   . Skin abscess     breast    Past Surgical History  Procedure  Laterality Date  . Breast reduction surgery    . Breast surgery  July 2010    breast reduction   . Breast surgery  December 2010    fix breast reduction   . Tonsillectomy and adenoidectomy  age 23   No family history on file. History  Substance Use Topics  . Smoking status: Never Smoker   . Smokeless tobacco: Never Used  . Alcohol Use: Yes     Comment: occas.   OB History   Grav Para Term Preterm Abortions TAB SAB Ect Mult Living                 Review of Systems  Constitutional: Positive for chills and diaphoresis.  Gastrointestinal: Positive for nausea, vomiting, abdominal pain and diarrhea. Negative for abdominal distention.  All other systems reviewed and are negative.    Allergies  Amoxicillin and Compazine  Home Medications   Current Outpatient Rx  Name  Route  Sig  Dispense  Refill  . ALPRAZolam (XANAX) 1 MG tablet   Oral   Take 1 mg by mouth 2 (two) times daily.           Marland Kitchen azaTHIOprine (IMURAN) 50 MG tablet   Oral   Take 75 mg by mouth daily. Pt taking 75 mg         . baclofen (LIORESAL) 10  MG tablet   Oral   Take 10 mg by mouth 3 (three) times daily as needed for muscle spasms.         . cholecalciferol (VITAMIN D) 1000 UNITS tablet   Oral   Take 1,000 Units by mouth daily.         . Norethindrone Acetate-Ethinyl Estrad-FE (LOMEDIA 24 FE) 1-20 MG-MCG(24) tablet   Oral   Take 1 tablet by mouth daily.         . ondansetron (ZOFRAN ODT) 4 MG disintegrating tablet      4mg  ODT q4 hours prn nausea/vomit   10 tablet   0   . pantoprazole (PROTONIX) 40 MG tablet   Oral   Take 40 mg by mouth 2 (two) times daily.           . predniSONE (DELTASONE) 1 MG tablet   Oral   Take 3 mg by mouth daily.         . Probiotic Product (PROBIOTIC DAILY) CAPS   Oral   Take by mouth. Once or twice a day         . rizatriptan (MAXALT-MLT) 5 MG disintegrating tablet   Oral   Take 1 tablet (5 mg total) by mouth as needed for migraine. May repeat  in 2 hours if needed   15 tablet   12   . topiramate (TOPAMAX) 25 MG tablet      Take one tab po bid xone week, then 2 tabs po bid   120 tablet   12    BP 137/90  Pulse 133  Temp(Src) 98.4 F (36.9 C) (Oral)  Resp 18  SpO2 97%  LMP 12/02/2013 Physical Exam  Nursing note and vitals reviewed. Constitutional: She appears well-developed and well-nourished. No distress.  HENT:  Head: Normocephalic and atraumatic.  Neck: Neck supple.  Cardiovascular: Normal rate and regular rhythm.   Pulmonary/Chest: Effort normal and breath sounds normal. No respiratory distress. She has no wheezes. She has no rales.  Abdominal: Soft. She exhibits no distension. There is generalized tenderness. There is rigidity.  Pt holding abdominal muscles very taught and exam is difficult as patient is extremely tender to palpation.   Neurological: She is alert.  Skin: She is not diaphoretic.    ED Course  Procedures (including critical care time) Labs Review Labs Reviewed  CBC WITH DIFFERENTIAL - Abnormal; Notable for the following:    Neutrophils Relative % 93 (*)    Lymphocytes Relative 5 (*)    Lymphs Abs 0.2 (*)    Monocytes Relative 2 (*)    All other components within normal limits  COMPREHENSIVE METABOLIC PANEL - Abnormal; Notable for the following:    Potassium 3.5 (*)    Glucose, Bld 133 (*)    All other components within normal limits  URINALYSIS, ROUTINE W REFLEX MICROSCOPIC - Abnormal; Notable for the following:    Ketones, ur 15 (*)    Leukocytes, UA TRACE (*)    All other components within normal limits  LIPASE, BLOOD  URINE MICROSCOPIC-ADD ON  POCT PREGNANCY, URINE   Imaging Review Ct Abdomen Pelvis W Contrast  12/09/2013   CLINICAL DATA:  Abdominal pain  EXAM: CT ABDOMEN AND PELVIS WITH CONTRAST  TECHNIQUE: Multidetector CT imaging of the abdomen and pelvis was performed using the standard protocol following bolus administration of intravenous contrast.  CONTRAST:  80mL OMNIPAQUE  IOHEXOL 300 MG/ML  SOLN  COMPARISON:  12/09/2013; prior CT of 08/29/2011  FINDINGS: Contrast medium observed  in the distal esophagus, raising suspicion for gastroesophageal reflux.  The liver, spleen, pancreas, and adrenal glands appear unremarkable. No specific gallbladder or biliary abnormality identified. Kidneys and proximal ureters unremarkable. No pathologic upper abdominal adenopathy is observed.  Borderline wall thickening in much of the colon is attributed to nondistention, and not considered specific for colitis.  Mildly dilated descending duodenum with mildly reduced SMA angle and aorta mesenteric distance, raising the possibility of low-grade SMA syndrome.  No pathologic pelvic adenopathy is observed. Uterine and adnexal contours unremarkable. Urinary bladder unremarkable.  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 *Gastroesophageal reflux.   Electronically Signed   By: Herbie BaltimoreWalt  Liebkemann M.D.   On: 12/09/2013 18:56   Dg Abd Acute W/chest  12/09/2013   CLINICAL DATA:  Abdominal pain.  Hematemesis  EXAM: ACUTE ABDOMEN SERIES (ABDOMEN 2 VIEW & CHEST 1 VIEW)  COMPARISON:  None.  FINDINGS: There is no evidence of dilated bowel loops or free intraperitoneal air. No radiopaque calculi or other significant radiographic abnormality is seen. Heart size and mediastinal contours are within normal limits. Both lungs are clear.  IMPRESSION: Negative abdominal radiographs.  No acute cardiopulmonary disease.   Electronically Signed   By: Elige KoHetal  Patel   On: 12/09/2013 16:36    EKG Interpretation   None      3:40 PM Dr Fayrene FearingJames made aware of the patient, will also seen and examine patient.    MDM   1. SMAS (superior mesenteric artery syndrome)   2. Abdominal pain   3. Nausea and vomiting      Pt with severe abdominal pain x 3 days following bloody emesis.  Hgb is normal, WBC  normal.  Mild hypokalemia. Labs otherwise unremarkable. CT abd/pelvis shows low grade SMA syndrome and GERD.  Discussed all results and reviewed CT with Dr Fayrene FearingJames and discussed with patient and her mother.  Pt to be d/c home with percocet, phenergan, GI follow up.  Discussed result, findings, treatment, and follow up  with patient.  Pt given return precautions.  Pt verbalizes understanding and agrees with plan.      I doubt any other EMC precluding discharge at this time including, but not necessarily limited to the following: ischemic colitis, perforated viscus     Trixie Dredgemily Henli Hey, PA-C 12/09/13 2329  Trixie DredgeEmily Luiscarlos Kaczmarczyk, PA-C 12/09/13 2330

## 2013-12-09 NOTE — ED Notes (Signed)
Patient speaking with MD Fayrene FearingJames, mother informed that we need a urine specimen. Patient verifies understanding.

## 2013-12-09 NOTE — ED Notes (Signed)
Per pt, has a history of migraines-vomiting due to H/A-abdominal pain for one week-vomited  Blood on Wed-gastro advised ED to eval

## 2013-12-09 NOTE — Discharge Instructions (Signed)
Read the information below.  Use the prescribed medication as directed.  Please discuss all new medications with your pharmacist.  Do not take additional tylenol while taking the prescribed pain medication to avoid overdose.  You may return to the Emergency Department at any time for worsening condition or any new symptoms that concern you.  If you develop high fevers, worsening abdominal pain, uncontrolled vomiting, or are unable to tolerate fluids by mouth, return to the ER for a recheck.     Abdominal Pain Abdominal pain can be caused by many things. Your caregiver decides the seriousness of your pain by an examination and possibly blood tests and X-rays. Many cases can be observed and treated at home. Most abdominal pain is not caused by a disease and will probably improve without treatment. However, in many cases, more time must pass before a clear cause of the pain can be found. Before that point, it may not be known if you need more testing, or if hospitalization or surgery is needed. HOME CARE INSTRUCTIONS   Do not take laxatives unless directed by your caregiver.  Take pain medicine only as directed by your caregiver.  Only take over-the-counter or prescription medicines for pain, discomfort, or fever as directed by your caregiver.  Try a clear liquid diet (broth, tea, or water) for as long as directed by your caregiver. Slowly move to a bland diet as tolerated. SEEK IMMEDIATE MEDICAL CARE IF:   The pain does not go away.  You have a fever.  You keep throwing up (vomiting).  The pain is felt only in portions of the abdomen. Pain in the right side could possibly be appendicitis. In an adult, pain in the left lower portion of the abdomen could be colitis or diverticulitis.  You pass bloody or black tarry stools. MAKE SURE YOU:   Understand these instructions.  Will watch your condition.  Will get help right away if you are not doing well or get worse. Document Released:  09/03/2005 Document Revised: 02/16/2012 Document Reviewed: 07/12/2008 [facility] Patient Information 2014 [place], [place].  Nausea and Vomiting Nausea is a sick feeling that often comes before throwing up (vomiting). Vomiting is a reflex where stomach contents come out of your mouth. Vomiting can cause severe loss of body fluids (dehydration). Children and elderly adults can become dehydrated quickly, especially if they also have diarrhea. Nausea and vomiting are symptoms of a condition or disease. It is important to find the cause of your symptoms. CAUSES   Direct irritation of the stomach lining. This irritation can result from increased acid production (gastroesophageal reflux disease), infection, food poisoning, taking certain medicines (such as nonsteroidal anti-inflammatory drugs), alcohol use, or tobacco use.  Signals from the brain.These signals could be caused by a headache, heat exposure, an inner ear disturbance, increased pressure in the brain from injury, infection, a tumor, or a concussion, pain, emotional stimulus, or metabolic problems.  An obstruction in the gastrointestinal tract (bowel obstruction).  Illnesses such as diabetes, hepatitis, gallbladder problems, appendicitis, kidney problems, cancer, sepsis, atypical symptoms of a heart attack, or eating disorders.  Medical treatments such as chemotherapy and radiation.  Receiving medicine that makes you sleep (general anesthetic) during surgery. DIAGNOSIS Your caregiver may ask for tests to be done if the problems do not improve after a few days. Tests may also be done if symptoms are severe or if the reason for the nausea and vomiting is not clear. Tests may include:  Urine tests.  Blood tests.  Stool tests.  Cultures (to look for evidence of infection).  X-rays or other imaging studies. Test results can help your caregiver make decisions about treatment or the need for additional tests. TREATMENT You need to stay  well hydrated. Drink frequently but in small amounts.You may wish to drink water, sports drinks, clear broth, or eat frozen ice pops or gelatin dessert to help stay hydrated.When you eat, eating slowly may help prevent nausea.There are also some antinausea medicines that may help prevent nausea. HOME CARE INSTRUCTIONS   Take all medicine as directed by your caregiver.  If you do not have an appetite, do not force yourself to eat. However, you must continue to drink fluids.  If you have an appetite, eat a normal diet unless your caregiver tells you differently.  Eat a variety of complex carbohydrates (rice, wheat, potatoes, bread), lean meats, yogurt, fruits, and vegetables.  Avoid high-fat foods because they are more difficult to digest.  Drink enough water and fluids to keep your urine clear or pale yellow.  If you are dehydrated, ask your caregiver for specific rehydration instructions. Signs of dehydration may include:  Severe thirst.  Dry lips and mouth.  Dizziness.  Dark urine.  Decreasing urine frequency and amount.  Confusion.  Rapid breathing or pulse. SEEK IMMEDIATE MEDICAL CARE IF:   You have blood or brown flecks (like coffee grounds) in your vomit.  You have black or bloody stools.  You have a severe headache or stiff neck.  You are confused.  You have severe abdominal pain.  You have chest pain or trouble breathing.  You do not urinate at least once every 8 hours.  You develop cold or clammy skin.  You continue to vomit for longer than 24 to 48 hours.  You have a fever. MAKE SURE YOU:   Understand these instructions.  Will watch your condition.  Will get help right away if you are not doing well or get worse. Document Released: 11/24/2005 Document Revised: 02/16/2012 Document Reviewed: 04/23/2011 [facility] Patient Information 2014 [place], [place].

## 2013-12-10 LAB — OCCULT BLOOD, POC DEVICE: Fecal Occult Bld: NEGATIVE

## 2013-12-12 NOTE — Progress Notes (Signed)
Quick Note:  Spoke to patient and relayed normal MRI brain results, per Dr. Terrace ArabiaYan. ______

## 2013-12-15 ENCOUNTER — Other Ambulatory Visit: Payer: Self-pay | Admitting: Gastroenterology

## 2013-12-15 DIAGNOSIS — R112 Nausea with vomiting, unspecified: Secondary | ICD-10-CM

## 2013-12-16 ENCOUNTER — Ambulatory Visit
Admission: RE | Admit: 2013-12-16 | Discharge: 2013-12-16 | Disposition: A | Discharge status: Home / Self Care | Payer: BC Managed Care – PPO | Source: Ambulatory Visit | Attending: Gastroenterology | Admitting: Gastroenterology

## 2013-12-16 DIAGNOSIS — R112 Nausea with vomiting, unspecified: Secondary | ICD-10-CM

## 2013-12-19 NOTE — ED Provider Notes (Signed)
Medical screening examination/treatment/procedure(s) were performed by non-physician practitioner and as supervising physician I was immediately available for consultation/collaboration.  EKG Interpretation   None         Rolland PorterMark Wandell Scullion, MD 12/19/13 [phone]

## 2014-02-24 ENCOUNTER — Ambulatory Visit: Payer: 59 | Admitting: Nurse Practitioner

## 2014-09-18 ENCOUNTER — Other Ambulatory Visit: Payer: Self-pay | Admitting: Otolaryngology

## 2014-09-18 DIAGNOSIS — J018 Other acute sinusitis: Secondary | ICD-10-CM

## 2014-09-21 ENCOUNTER — Ambulatory Visit
Admission: RE | Admit: 2014-09-21 | Discharge: 2014-09-21 | Disposition: A | Discharge status: Home / Self Care | Payer: BC Managed Care – PPO | Source: Ambulatory Visit | Attending: Otolaryngology | Admitting: Otolaryngology

## 2014-09-21 DIAGNOSIS — J018 Other acute sinusitis: Secondary | ICD-10-CM

## 2014-12-19 ENCOUNTER — Ambulatory Visit (INDEPENDENT_AMBULATORY_CARE_PROVIDER_SITE_OTHER): Payer: BC Managed Care – PPO | Admitting: Neurology

## 2014-12-19 ENCOUNTER — Encounter: Payer: Self-pay | Admitting: Neurology

## 2014-12-19 VITALS — BP 113/81 | HR 90 | Ht 61.0 in | Wt 118.8 lb

## 2014-12-19 DIAGNOSIS — G43711 Chronic migraine without aura, intractable, with status migrainosus: Secondary | ICD-10-CM

## 2014-12-19 MED ORDER — SUMATRIPTAN SUCCINATE 100 MG PO TABS: 100.0000 mg | ORAL_TABLET | Freq: Two times a day (BID) | ORAL | Status: AC | PRN

## 2014-12-19 MED ORDER — PREDNISONE 10 MG PO TABS: ORAL_TABLET | ORAL | Status: AC

## 2014-12-19 MED ORDER — AMITRIPTYLINE HCL 10 MG PO TABS: ORAL_TABLET | ORAL | Status: AC

## 2014-12-19 NOTE — Patient Instructions (Signed)

## 2014-12-19 NOTE — Progress Notes (Signed)
Reason for visit: migraine headache  Emily Reed is a 24 y.o. female  HistorTalmadge Coventryy of present illness:  Emily Reed is a 24 year old right-handed white female with a history of lupus and a history of migraine headache. The patient indicates that she has had migraine headaches since she was in middle school. The headaches generally were under relatively good control until about one year ago. The patient had developed daily headaches around that time, and she was seen by Dr. Terrace ArabiaYan, and she was placed on Topamax. The Topamax was not beneficial. The patient underwent surgery for a superior mesenteric artery syndrome that was causing a lot of nausea and vomiting. Following surgery, the patient indicated that her headaches disappeared. The patient has undergone MRI evaluation of the brain in December 2014, and the study was normal. The patient is on low-dose prednisone, and she is on Imuran for her lupus. The patient began having headaches 2 months ago, and the headaches are now daily in nature. The patient indicates that the headaches are frontal, temporal, and occipital. The patient may have neck stiffness with the headache. She has photophobia, phonophobia, nausea and vomiting. She reports sensitivity to certain types of perfumes or odors. She has also had some problem with concentration and memory during the headache. She has some slight balance problem with the dizziness. The patient does report a lot of fatigue, occasionally she will have difficulty with sleeping. In the past, amitriptyline has been beneficial for her with the nausea and headache issue. She is taking only Advil for the headache without much benefit currently.  Past Medical History  Diagnosis Date  . IBS (irritable bowel syndrome)   . JRA (juvenile rheumatoid arthritis)   . Lupus (systemic lupus erythematosus)   . Fibromyalgia   . GERD (gastroesophageal reflux disease)     since birth  . History of migraine headaches   . Personal  history of methicillin resistant Staphylococcus aureus   . Unspecified sinusitis (chronic) 08/13/11  . Diarrhea 08/13/11  . Juvenile rheumatoid arthritis   . Chills with fever   . Weight loss, unintentional   . Visual disturbances     eyes getting weaker and blurred vision   . Cough   . Wheezing   . Chest pain   . Abdominal distention   . Abdominal pain   . Diarrhea   . Nausea and vomiting   . Stress headaches   . Migraines   . Weakness generalized   . Fatigue   . Skin abscess     breast     Past Surgical History  Procedure Laterality Date  . Breast reduction surgery    . Breast surgery  July 2010    breast reduction   . Breast surgery  December 2010    fix breast reduction   . Tonsillectomy and adenoidectomy  age 89    Family History  Problem Relation Age of Onset  . Crohn's disease Brother   . Migraines Brother     Social history:  reports that she has never smoked. She has never used smokeless tobacco. She reports that she drinks alcohol. She reports that she does not use illicit drugs.  Medications:  Current Outpatient Prescriptions on File Prior to Visit  Medication Sig Dispense Refill  . ALPRAZolam (XANAX) 1 MG tablet Take 1 mg by mouth daily.     . Norethindrone Acetate-Ethinyl Estrad-FE (LOMEDIA 24 FE) 1-20 MG-MCG(24) tablet Take 1 tablet by mouth daily.    . ondansetron (ZOFRAN-ODT)  4 MG disintegrating tablet Take 4 mg by mouth every 8 (eight) hours as needed for nausea or vomiting.    Marland Kitchen oxyCODONE-acetaminophen (PERCOCET/ROXICET) 5-325 MG per tablet Take 1-2 tablets by mouth every 4 (four) hours as needed for severe pain. 20 tablet 0  . pantoprazole (PROTONIX) 40 MG tablet Take 40 mg by mouth 2 (two) times daily.     . predniSONE (STERAPRED UNI-PAK) 5 MG TABS tablet Take 5 mg by mouth daily. TAPER by  each day till finished for sinus infection    . Probiotic Product (PROBIOTIC DAILY) CAPS Take 1 tablet by mouth daily. Once or twice a day     No current  facility-administered medications on file prior to visit.      Allergies  Allergen Reactions  . Amoxicillin Nausea Only  . Compazine [Prochlorperazine] Other (See Comments)    Makes me crazy    ROS:  Out of a complete 14 system review of symptoms, the patient complains only of the following symptoms, and all other reviewed systems are negative.  Low-grade fevers, weight gain, fatigue Palpitations of the heart Spinning sensations Skin rash Blurred vision, eye pain Blood in the stool Increased thirst Joint pain, achy muscles Memory loss, confusion, headache, weakness, dizziness, visual dimming Anxiety, too much sleep, decreased energy, change in appetite  Blood pressure 113/81, pulse 90, height  (1.549 m), weight 118 lb 12.8 oz (53.887 kg).  Physical Exam  General: The patient is alert and cooperative at the time of the examination.  Eyes: Pupils are equal, round, and reactive to light. Discs are flat bilaterally.  Neck: The neck is supple, no carotid bruits are noted.  Respiratory: The respiratory examination is clear.  Cardiovascular: The cardiovascular examination reveals a regular rate and rhythm, no obvious murmurs or rubs are noted.  Neuromuscular: Range of movement of the cervical spine is full. No crepitus is noted in the temporomandibular joints.  Skin: Extremities are without significant edema.  Neurologic Exam  Mental status: The patient is alert and oriented x 3 at the time of the examination. The patient has apparent normal recent and remote memory, with an apparently normal attention span and concentration ability.  Cranial nerves: Facial symmetry is present. There is good sensation of the face to pinprick and soft touch bilaterally. The strength of the facial muscles and the muscles to head turning and shoulder shrug are normal bilaterally. Speech is well enunciated, no aphasia or dysarthria is noted. Extraocular movements are full. Visual fields are  full. The tongue is midline, and the patient has symmetric elevation of the soft palate. No obvious hearing deficits are noted.  Motor: The motor testing reveals 5 over 5 strength of all 4 extremities. Good symmetric motor tone is noted throughout.  Sensory: Sensory testing is intact to pinprick, soft touch, vibration sensation, and position sense on all 4 extremities. No evidence of extinction is noted.  Coordination: Cerebellar testing reveals good finger-nose-finger and heel-to-shin bilaterally.  Gait and station: Gait is normal. Tandem gait is normal. Romberg is negative. No drift is seen.  Reflexes: Deep tendon reflexes are symmetric and normal bilaterally. Toes are downgoing bilaterally.   MRI brain December 01, 2013:  IMPRESSION: Normal MRI scan of the brain with and without contrast   Assessment/Plan:  1. Migraine headache  2. History of lupus  The patient has had converted migraine within the last 2 months. She is having nausea, visual dimming, photophobia, phonophobia, and neck stiffness with the headache. The patient will be  placed on low-dose amitriptyline, the dose can be increased if she is tolerating the medication. She will be placed on Imitrex, and she will be placed on a prednisone Dosepak. She will follow-up in 2-3 months. She will contact me if she is not doing well. She may be a candidate for Botox injections in the future.  Marlan Palau MD 12/19/2014 8:31 PM  [facility] [address] [place], [place] [address]  Phone [phone] Fax [phone]

## 2014-12-21 ENCOUNTER — Telehealth: Payer: Self-pay | Admitting: Neurology

## 2014-12-21 MED ORDER — ISOMETHEPTENE-DICHLORAL-APAP 65-100-325 MG PO CAPS: 1.0000 | ORAL_CAPSULE | Freq: Four times a day (QID) | ORAL | Status: AC | PRN

## 2014-12-21 NOTE — Telephone Encounter (Signed)
I called back.  Ms Talmadge CoventrySamsell said the patient has a migraine with dizziness.  Says they had to drive her to work due to vertigo, and once she got there she had difficulty standing for any period of time.  Says she has been taking Prednisone and Amitriptyline, and as well has taken 4 Imitrex the past 2 days (2 last night and 2 the night before) without any relief.  Would like to know if something else can be recommended.  Also wanted provider to know ins has restrictions, and will not allow more than 4 Imitrex tabs to be dispensed at a time.  Please advise.  Thank you.

## 2014-12-21 NOTE — Telephone Encounter (Signed)
Pt's mom is calling to state that SUMAtriptan (IMITREX) 100 MG tablet is not working.  She has had 2 doses.  Is there something else that she can take? She has had no relief.  Please call and advise.

## 2014-12-21 NOTE — Telephone Encounter (Signed)
I called patient. I talk with the mother. The insurance would only allow 4 tablets of Imitrex per prescription. The Imitrex did not offer any benefit. She still having headaches, I will try Midrin

## 2014-12-22 ENCOUNTER — Telehealth: Payer: Self-pay | Admitting: Neurology

## 2014-12-22 NOTE — Telephone Encounter (Signed)
I called back.  Spoke with the pharmacist.  He said there are not any issues with the Rx, they apparently looked at it wrong, but realized it is correct.  Nothing further is needed at this time.

## 2014-12-22 NOTE — Telephone Encounter (Signed)
Per [facility] the Rx for APAP-Isometheptene-Dichloral 325-65-100 MG per capsule was written wrong it was written  For 325-85-100mg . Please correct and refax.

## 2015-01-01 ENCOUNTER — Telehealth: Payer: Self-pay | Admitting: Neurology

## 2015-01-01 NOTE — Telephone Encounter (Signed)
Office note has been faxed to Dr. Kae Hellerriscione-Schreiber at [facility] and confirmation received.

## 2015-01-01 NOTE — Telephone Encounter (Signed)
-----   Message from York Spanielharles K Willis, MD sent at 12/19/2014  8:36 PM EST ----- Please fax a copy of the NP evaluation to dr. Kae Hellerriscione-Schreiber at [facility]. Fax # is [phone]. Thanks.

## 2015-03-21 ENCOUNTER — Ambulatory Visit: Payer: BC Managed Care – PPO | Admitting: Adult Health

## 2017-02-03 ENCOUNTER — Other Ambulatory Visit: Payer: Self-pay | Admitting: Gastroenterology

## 2017-02-03 DIAGNOSIS — R112 Nausea with vomiting, unspecified: Secondary | ICD-10-CM

## 2017-02-16 ENCOUNTER — Other Ambulatory Visit: Payer: BC Managed Care – PPO

## 2017-02-17 ENCOUNTER — Other Ambulatory Visit: Payer: Self-pay | Admitting: Gastroenterology

## 2017-02-17 DIAGNOSIS — R1012 Left upper quadrant pain: Secondary | ICD-10-CM

## 2017-03-03 ENCOUNTER — Ambulatory Visit
Admission: RE | Admit: 2017-03-03 | Discharge: 2017-03-03 | Disposition: A | Discharge status: Home / Self Care | Payer: BLUE CROSS/BLUE SHIELD | Source: Ambulatory Visit | Attending: Gastroenterology | Admitting: Gastroenterology

## 2017-03-03 DIAGNOSIS — R1012 Left upper quadrant pain: Secondary | ICD-10-CM

## 2017-03-04 ENCOUNTER — Other Ambulatory Visit: Payer: Self-pay | Admitting: Gastroenterology

## 2017-03-04 DIAGNOSIS — R1084 Generalized abdominal pain: Secondary | ICD-10-CM

## 2017-03-04 DIAGNOSIS — R110 Nausea: Secondary | ICD-10-CM

## 2017-03-09 ENCOUNTER — Ambulatory Visit
Admission: RE | Admit: 2017-03-09 | Discharge: 2017-03-09 | Disposition: A | Discharge status: Home / Self Care | Payer: BLUE CROSS/BLUE SHIELD | Source: Ambulatory Visit | Attending: Gastroenterology | Admitting: Gastroenterology

## 2017-03-09 DIAGNOSIS — R1084 Generalized abdominal pain: Secondary | ICD-10-CM

## 2017-03-09 DIAGNOSIS — R110 Nausea: Secondary | ICD-10-CM

## 2017-03-09 MED ORDER — IOPAMIDOL (ISOVUE-300) INJECTION 61%
100.0000 mL | Freq: Once | INTRAVENOUS | Status: AC | PRN
  Administered 2017-03-09: 100 mL via INTRAVENOUS

## 2017-03-11 ENCOUNTER — Other Ambulatory Visit (HOSPITAL_COMMUNITY): Payer: Self-pay | Admitting: Gastroenterology

## 2017-03-11 DIAGNOSIS — R1084 Generalized abdominal pain: Secondary | ICD-10-CM

## 2017-03-11 DIAGNOSIS — R110 Nausea: Secondary | ICD-10-CM

## 2017-03-17 ENCOUNTER — Encounter (HOSPITAL_COMMUNITY)
Admission: RE | Admit: 2017-03-17 | Discharge: 2017-03-17 | Disposition: A | Discharge status: Home / Self Care | Payer: BLUE CROSS/BLUE SHIELD | Source: Ambulatory Visit | Attending: Gastroenterology | Admitting: Gastroenterology

## 2017-03-17 DIAGNOSIS — R110 Nausea: Secondary | ICD-10-CM

## 2017-03-17 DIAGNOSIS — R1084 Generalized abdominal pain: Secondary | ICD-10-CM

## 2017-03-17 MED ORDER — TECHNETIUM TC 99M SULFUR COLLOID
2.0000 | Freq: Once | INTRAVENOUS | Status: AC | PRN
  Administered 2017-03-17: 2 via ORAL

## 2017-05-21 DIAGNOSIS — G43A0 Cyclical vomiting, not intractable: Secondary | ICD-10-CM | POA: Diagnosis not present

## 2017-05-21 DIAGNOSIS — Z1159 Encounter for screening for other viral diseases: Secondary | ICD-10-CM | POA: Diagnosis not present

## 2017-05-22 DIAGNOSIS — Z79899 Other long term (current) drug therapy: Secondary | ICD-10-CM | POA: Diagnosis not present

## 2017-05-22 DIAGNOSIS — M328 Other forms of systemic lupus erythematosus: Secondary | ICD-10-CM | POA: Diagnosis not present

## 2017-05-25 DIAGNOSIS — R109 Unspecified abdominal pain: Secondary | ICD-10-CM | POA: Diagnosis not present

## 2017-05-25 DIAGNOSIS — G43A0 Cyclical vomiting, not intractable: Secondary | ICD-10-CM | POA: Diagnosis not present

## 2017-06-16 DIAGNOSIS — R9431 Abnormal electrocardiogram [ECG] [EKG]: Secondary | ICD-10-CM | POA: Diagnosis not present

## 2017-06-16 DIAGNOSIS — R002 Palpitations: Secondary | ICD-10-CM | POA: Diagnosis not present

## 2017-06-16 DIAGNOSIS — M328 Other forms of systemic lupus erythematosus: Secondary | ICD-10-CM | POA: Diagnosis not present

## 2017-06-16 DIAGNOSIS — E559 Vitamin D deficiency, unspecified: Secondary | ICD-10-CM | POA: Diagnosis not present

## 2017-06-24 DIAGNOSIS — G43719 Chronic migraine without aura, intractable, without status migrainosus: Secondary | ICD-10-CM | POA: Diagnosis not present

## 2017-07-06 DIAGNOSIS — G518 Other disorders of facial nerve: Secondary | ICD-10-CM | POA: Diagnosis not present

## 2017-07-06 DIAGNOSIS — G43719 Chronic migraine without aura, intractable, without status migrainosus: Secondary | ICD-10-CM | POA: Diagnosis not present

## 2017-07-06 DIAGNOSIS — L659 Nonscarring hair loss, unspecified: Secondary | ICD-10-CM | POA: Diagnosis not present

## 2017-07-06 DIAGNOSIS — M542 Cervicalgia: Secondary | ICD-10-CM | POA: Diagnosis not present

## 2017-07-06 DIAGNOSIS — N926 Irregular menstruation, unspecified: Secondary | ICD-10-CM | POA: Diagnosis not present

## 2017-07-06 DIAGNOSIS — M791 Myalgia: Secondary | ICD-10-CM | POA: Diagnosis not present

## 2017-07-22 DIAGNOSIS — G43A0 Cyclical vomiting, not intractable: Secondary | ICD-10-CM | POA: Diagnosis not present

## 2017-07-27 DIAGNOSIS — K3184 Gastroparesis: Secondary | ICD-10-CM | POA: Diagnosis not present

## 2017-08-05 DIAGNOSIS — M329 Systemic lupus erythematosus, unspecified: Secondary | ICD-10-CM | POA: Diagnosis not present

## 2017-08-05 DIAGNOSIS — L659 Nonscarring hair loss, unspecified: Secondary | ICD-10-CM | POA: Diagnosis not present

## 2017-08-05 DIAGNOSIS — N926 Irregular menstruation, unspecified: Secondary | ICD-10-CM | POA: Diagnosis not present

## 2017-08-05 DIAGNOSIS — Z1379 Encounter for other screening for genetic and chromosomal anomalies: Secondary | ICD-10-CM | POA: Diagnosis not present

## 2017-09-10 DIAGNOSIS — Z79899 Other long term (current) drug therapy: Secondary | ICD-10-CM | POA: Diagnosis not present

## 2017-09-10 DIAGNOSIS — M328 Other forms of systemic lupus erythematosus: Secondary | ICD-10-CM | POA: Diagnosis not present

## 2017-09-16 DIAGNOSIS — G518 Other disorders of facial nerve: Secondary | ICD-10-CM | POA: Diagnosis not present

## 2017-09-16 DIAGNOSIS — M791 Myalgia, unspecified site: Secondary | ICD-10-CM | POA: Diagnosis not present

## 2017-09-16 DIAGNOSIS — M542 Cervicalgia: Secondary | ICD-10-CM | POA: Diagnosis not present

## 2017-09-16 DIAGNOSIS — G43719 Chronic migraine without aura, intractable, without status migrainosus: Secondary | ICD-10-CM | POA: Diagnosis not present

## 2017-09-18 DIAGNOSIS — R718 Other abnormality of red blood cells: Secondary | ICD-10-CM | POA: Diagnosis not present

## 2017-09-22 DIAGNOSIS — Z87898 Personal history of other specified conditions: Secondary | ICD-10-CM | POA: Diagnosis not present

## 2017-09-22 DIAGNOSIS — Z01419 Encounter for gynecological examination (general) (routine) without abnormal findings: Secondary | ICD-10-CM | POA: Diagnosis not present

## 2017-09-22 DIAGNOSIS — M329 Systemic lupus erythematosus, unspecified: Secondary | ICD-10-CM | POA: Diagnosis not present

## 2017-09-22 DIAGNOSIS — Z1379 Encounter for other screening for genetic and chromosomal anomalies: Secondary | ICD-10-CM | POA: Diagnosis not present

## 2017-09-22 DIAGNOSIS — R87610 Atypical squamous cells of undetermined significance on cytologic smear of cervix (ASC-US): Secondary | ICD-10-CM | POA: Diagnosis not present

## 2017-09-22 DIAGNOSIS — N926 Irregular menstruation, unspecified: Secondary | ICD-10-CM | POA: Diagnosis not present

## 2017-09-30 DIAGNOSIS — Z72820 Sleep deprivation: Secondary | ICD-10-CM | POA: Diagnosis not present

## 2017-09-30 DIAGNOSIS — L03019 Cellulitis of unspecified finger: Secondary | ICD-10-CM | POA: Diagnosis not present

## 2017-10-01 DIAGNOSIS — L03019 Cellulitis of unspecified finger: Secondary | ICD-10-CM | POA: Diagnosis not present

## 2017-10-28 DIAGNOSIS — Z7189 Other specified counseling: Secondary | ICD-10-CM | POA: Diagnosis not present

## 2017-12-02 DIAGNOSIS — M542 Cervicalgia: Secondary | ICD-10-CM | POA: Diagnosis not present

## 2017-12-02 DIAGNOSIS — M791 Myalgia, unspecified site: Secondary | ICD-10-CM | POA: Diagnosis not present

## 2017-12-02 DIAGNOSIS — G43719 Chronic migraine without aura, intractable, without status migrainosus: Secondary | ICD-10-CM | POA: Diagnosis not present

## 2017-12-02 DIAGNOSIS — G518 Other disorders of facial nerve: Secondary | ICD-10-CM | POA: Diagnosis not present

## 2017-12-03 DIAGNOSIS — Z3202 Encounter for pregnancy test, result negative: Secondary | ICD-10-CM | POA: Diagnosis not present

## 2017-12-03 DIAGNOSIS — R87610 Atypical squamous cells of undetermined significance on cytologic smear of cervix (ASC-US): Secondary | ICD-10-CM | POA: Diagnosis not present

## 2017-12-03 DIAGNOSIS — B977 Papillomavirus as the cause of diseases classified elsewhere: Secondary | ICD-10-CM | POA: Diagnosis not present

## 2017-12-03 DIAGNOSIS — R87810 Cervical high risk human papillomavirus (HPV) DNA test positive: Secondary | ICD-10-CM | POA: Diagnosis not present

## 2017-12-03 DIAGNOSIS — N870 Mild cervical dysplasia: Secondary | ICD-10-CM | POA: Diagnosis not present

## 2017-12-16 DIAGNOSIS — Z79899 Other long term (current) drug therapy: Secondary | ICD-10-CM | POA: Diagnosis not present

## 2017-12-16 DIAGNOSIS — M328 Other forms of systemic lupus erythematosus: Secondary | ICD-10-CM | POA: Diagnosis not present

## 2017-12-16 DIAGNOSIS — M0899 Juvenile arthritis, unspecified, multiple sites: Secondary | ICD-10-CM | POA: Diagnosis not present

## 2017-12-30 DIAGNOSIS — D485 Neoplasm of uncertain behavior of skin: Secondary | ICD-10-CM | POA: Diagnosis not present

## 2017-12-30 DIAGNOSIS — D2261 Melanocytic nevi of right upper limb, including shoulder: Secondary | ICD-10-CM | POA: Diagnosis not present

## 2017-12-30 DIAGNOSIS — B078 Other viral warts: Secondary | ICD-10-CM | POA: Diagnosis not present

## 2018-01-11 DIAGNOSIS — R112 Nausea with vomiting, unspecified: Secondary | ICD-10-CM | POA: Diagnosis not present

## 2018-01-21 DIAGNOSIS — M545 Low back pain: Secondary | ICD-10-CM | POA: Diagnosis not present

## 2018-01-21 DIAGNOSIS — M546 Pain in thoracic spine: Secondary | ICD-10-CM | POA: Diagnosis not present

## 2018-01-21 DIAGNOSIS — M5416 Radiculopathy, lumbar region: Secondary | ICD-10-CM | POA: Diagnosis not present

## 2018-03-17 DIAGNOSIS — M328 Other forms of systemic lupus erythematosus: Secondary | ICD-10-CM | POA: Diagnosis not present

## 2018-03-17 DIAGNOSIS — Z7952 Long term (current) use of systemic steroids: Secondary | ICD-10-CM | POA: Diagnosis not present

## 2018-03-17 DIAGNOSIS — Z79899 Other long term (current) drug therapy: Secondary | ICD-10-CM | POA: Diagnosis not present

## 2018-03-17 DIAGNOSIS — M25559 Pain in unspecified hip: Secondary | ICD-10-CM | POA: Diagnosis not present

## 2018-03-31 DIAGNOSIS — M24151 Other articular cartilage disorders, right hip: Secondary | ICD-10-CM | POA: Diagnosis not present

## 2018-03-31 DIAGNOSIS — M25551 Pain in right hip: Secondary | ICD-10-CM | POA: Diagnosis not present

## 2018-03-31 DIAGNOSIS — M25552 Pain in left hip: Secondary | ICD-10-CM | POA: Diagnosis not present

## 2018-03-31 DIAGNOSIS — Z9889 Other specified postprocedural states: Secondary | ICD-10-CM | POA: Diagnosis not present

## 2018-03-31 DIAGNOSIS — M24851 Other specific joint derangements of right hip, not elsewhere classified: Secondary | ICD-10-CM | POA: Diagnosis not present

## 2018-04-29 DIAGNOSIS — M25552 Pain in left hip: Secondary | ICD-10-CM | POA: Diagnosis not present

## 2018-04-29 DIAGNOSIS — M25551 Pain in right hip: Secondary | ICD-10-CM | POA: Diagnosis not present

## 2018-05-06 DIAGNOSIS — N9489 Other specified conditions associated with female genital organs and menstrual cycle: Secondary | ICD-10-CM | POA: Diagnosis not present

## 2018-06-01 DIAGNOSIS — Z3201 Encounter for pregnancy test, result positive: Secondary | ICD-10-CM | POA: Diagnosis not present

## 2018-06-01 DIAGNOSIS — N912 Amenorrhea, unspecified: Secondary | ICD-10-CM | POA: Diagnosis not present

## 2018-06-23 DIAGNOSIS — Z79899 Other long term (current) drug therapy: Secondary | ICD-10-CM | POA: Diagnosis not present

## 2018-06-23 DIAGNOSIS — E559 Vitamin D deficiency, unspecified: Secondary | ICD-10-CM | POA: Diagnosis not present

## 2018-06-23 DIAGNOSIS — E538 Deficiency of other specified B group vitamins: Secondary | ICD-10-CM | POA: Diagnosis not present

## 2018-06-23 DIAGNOSIS — R3915 Urgency of urination: Secondary | ICD-10-CM | POA: Diagnosis not present

## 2018-06-23 DIAGNOSIS — M328 Other forms of systemic lupus erythematosus: Secondary | ICD-10-CM | POA: Diagnosis not present

## 2018-06-28 DIAGNOSIS — Z3401 Encounter for supervision of normal first pregnancy, first trimester: Secondary | ICD-10-CM | POA: Diagnosis not present

## 2018-06-28 DIAGNOSIS — Z8279 Family history of other congenital malformations, deformations and chromosomal abnormalities: Secondary | ICD-10-CM | POA: Diagnosis not present

## 2018-07-07 DIAGNOSIS — O9989 Other specified diseases and conditions complicating pregnancy, childbirth and the puerperium: Secondary | ICD-10-CM | POA: Diagnosis not present

## 2018-07-07 DIAGNOSIS — R768 Other specified abnormal immunological findings in serum: Secondary | ICD-10-CM | POA: Diagnosis not present

## 2018-07-07 DIAGNOSIS — Z3A12 12 weeks gestation of pregnancy: Secondary | ICD-10-CM | POA: Diagnosis not present

## 2018-07-07 DIAGNOSIS — M329 Systemic lupus erythematosus, unspecified: Secondary | ICD-10-CM | POA: Diagnosis not present

## 2018-07-07 DIAGNOSIS — Z3682 Encounter for antenatal screening for nuchal translucency: Secondary | ICD-10-CM | POA: Diagnosis not present

## 2018-07-18 DIAGNOSIS — L259 Unspecified contact dermatitis, unspecified cause: Secondary | ICD-10-CM | POA: Diagnosis not present

## 2018-07-19 DIAGNOSIS — Z79899 Other long term (current) drug therapy: Secondary | ICD-10-CM | POA: Diagnosis not present

## 2018-07-19 DIAGNOSIS — M328 Other forms of systemic lupus erythematosus: Secondary | ICD-10-CM | POA: Diagnosis not present

## 2018-07-19 DIAGNOSIS — H02051 Trichiasis without entropian right upper eyelid: Secondary | ICD-10-CM | POA: Diagnosis not present

## 2018-08-04 DIAGNOSIS — R768 Other specified abnormal immunological findings in serum: Secondary | ICD-10-CM | POA: Diagnosis not present

## 2018-08-04 DIAGNOSIS — O9989 Other specified diseases and conditions complicating pregnancy, childbirth and the puerperium: Secondary | ICD-10-CM | POA: Diagnosis not present

## 2018-08-04 DIAGNOSIS — O99119 Other diseases of the blood and blood-forming organs and certain disorders involving the immune mechanism complicating pregnancy, unspecified trimester: Secondary | ICD-10-CM | POA: Diagnosis not present

## 2018-08-04 DIAGNOSIS — D6861 Antiphospholipid syndrome: Secondary | ICD-10-CM | POA: Diagnosis not present

## 2018-08-04 DIAGNOSIS — M329 Systemic lupus erythematosus, unspecified: Secondary | ICD-10-CM | POA: Diagnosis not present

## 2018-08-04 DIAGNOSIS — Z3A16 16 weeks gestation of pregnancy: Secondary | ICD-10-CM | POA: Diagnosis not present

## 2018-08-10 DIAGNOSIS — O4692 Antepartum hemorrhage, unspecified, second trimester: Secondary | ICD-10-CM | POA: Diagnosis not present

## 2018-08-18 DIAGNOSIS — M329 Systemic lupus erythematosus, unspecified: Secondary | ICD-10-CM | POA: Diagnosis not present

## 2018-08-18 DIAGNOSIS — Z3A18 18 weeks gestation of pregnancy: Secondary | ICD-10-CM | POA: Diagnosis not present

## 2018-08-18 DIAGNOSIS — R768 Other specified abnormal immunological findings in serum: Secondary | ICD-10-CM | POA: Diagnosis not present

## 2018-08-18 DIAGNOSIS — Z3686 Encounter for antenatal screening for cervical length: Secondary | ICD-10-CM | POA: Diagnosis not present

## 2018-08-18 DIAGNOSIS — O9989 Other specified diseases and conditions complicating pregnancy, childbirth and the puerperium: Secondary | ICD-10-CM | POA: Diagnosis not present

## 2018-09-01 DIAGNOSIS — O9989 Other specified diseases and conditions complicating pregnancy, childbirth and the puerperium: Secondary | ICD-10-CM | POA: Diagnosis not present

## 2018-09-01 DIAGNOSIS — Z3A20 20 weeks gestation of pregnancy: Secondary | ICD-10-CM | POA: Diagnosis not present

## 2018-09-01 DIAGNOSIS — O351XX0 Maternal care for (suspected) chromosomal abnormality in fetus, not applicable or unspecified: Secondary | ICD-10-CM | POA: Diagnosis not present

## 2018-09-01 DIAGNOSIS — R768 Other specified abnormal immunological findings in serum: Secondary | ICD-10-CM | POA: Diagnosis not present

## 2018-09-15 DIAGNOSIS — Z3A22 22 weeks gestation of pregnancy: Secondary | ICD-10-CM | POA: Diagnosis not present

## 2018-09-15 DIAGNOSIS — R768 Other specified abnormal immunological findings in serum: Secondary | ICD-10-CM | POA: Diagnosis not present

## 2018-09-15 DIAGNOSIS — M329 Systemic lupus erythematosus, unspecified: Secondary | ICD-10-CM | POA: Diagnosis not present

## 2018-09-15 DIAGNOSIS — O9989 Other specified diseases and conditions complicating pregnancy, childbirth and the puerperium: Secondary | ICD-10-CM | POA: Diagnosis not present

## 2018-09-17 DIAGNOSIS — M328 Other forms of systemic lupus erythematosus: Secondary | ICD-10-CM | POA: Diagnosis not present

## 2018-09-17 DIAGNOSIS — Z79899 Other long term (current) drug therapy: Secondary | ICD-10-CM | POA: Diagnosis not present

## 2018-09-17 DIAGNOSIS — Z6823 Body mass index (BMI) 23.0-23.9, adult: Secondary | ICD-10-CM | POA: Diagnosis not present

## 2018-09-17 DIAGNOSIS — Z7952 Long term (current) use of systemic steroids: Secondary | ICD-10-CM | POA: Diagnosis not present

## 2018-09-29 DIAGNOSIS — R768 Other specified abnormal immunological findings in serum: Secondary | ICD-10-CM | POA: Diagnosis not present

## 2018-09-29 DIAGNOSIS — O9989 Other specified diseases and conditions complicating pregnancy, childbirth and the puerperium: Secondary | ICD-10-CM | POA: Diagnosis not present

## 2018-09-29 DIAGNOSIS — O351XX0 Maternal care for (suspected) chromosomal abnormality in fetus, not applicable or unspecified: Secondary | ICD-10-CM | POA: Diagnosis not present

## 2018-09-29 DIAGNOSIS — N760 Acute vaginitis: Secondary | ICD-10-CM | POA: Diagnosis not present

## 2018-09-29 DIAGNOSIS — Z3A24 24 weeks gestation of pregnancy: Secondary | ICD-10-CM | POA: Diagnosis not present

## 2018-09-29 DIAGNOSIS — M3219 Other organ or system involvement in systemic lupus erythematosus: Secondary | ICD-10-CM | POA: Diagnosis not present

## 2018-10-04 DIAGNOSIS — I272 Pulmonary hypertension, unspecified: Secondary | ICD-10-CM | POA: Diagnosis not present

## 2018-10-04 DIAGNOSIS — M3219 Other organ or system involvement in systemic lupus erythematosus: Secondary | ICD-10-CM | POA: Diagnosis not present

## 2018-10-12 DIAGNOSIS — R300 Dysuria: Secondary | ICD-10-CM | POA: Diagnosis not present

## 2018-10-22 DIAGNOSIS — Z3401 Encounter for supervision of normal first pregnancy, first trimester: Secondary | ICD-10-CM | POA: Diagnosis not present

## 2018-10-27 DIAGNOSIS — O9989 Other specified diseases and conditions complicating pregnancy, childbirth and the puerperium: Secondary | ICD-10-CM | POA: Diagnosis not present

## 2018-10-27 DIAGNOSIS — O99413 Diseases of the circulatory system complicating pregnancy, third trimester: Secondary | ICD-10-CM | POA: Diagnosis not present

## 2018-10-27 DIAGNOSIS — I270 Primary pulmonary hypertension: Secondary | ICD-10-CM | POA: Diagnosis not present

## 2018-10-27 DIAGNOSIS — Z3A28 28 weeks gestation of pregnancy: Secondary | ICD-10-CM | POA: Diagnosis not present

## 2018-10-27 DIAGNOSIS — R768 Other specified abnormal immunological findings in serum: Secondary | ICD-10-CM | POA: Diagnosis not present

## 2018-10-27 DIAGNOSIS — M329 Systemic lupus erythematosus, unspecified: Secondary | ICD-10-CM | POA: Diagnosis not present

## 2018-10-28 DIAGNOSIS — R51 Headache: Secondary | ICD-10-CM | POA: Diagnosis not present

## 2018-10-28 DIAGNOSIS — O26892 Other specified pregnancy related conditions, second trimester: Secondary | ICD-10-CM | POA: Diagnosis not present

## 2018-11-01 DIAGNOSIS — Z23 Encounter for immunization: Secondary | ICD-10-CM | POA: Diagnosis not present

## 2018-11-01 DIAGNOSIS — Z3493 Encounter for supervision of normal pregnancy, unspecified, third trimester: Secondary | ICD-10-CM | POA: Diagnosis not present

## 2018-11-15 DIAGNOSIS — M3219 Other organ or system involvement in systemic lupus erythematosus: Secondary | ICD-10-CM | POA: Diagnosis not present

## 2018-11-17 DIAGNOSIS — Z3A31 31 weeks gestation of pregnancy: Secondary | ICD-10-CM | POA: Diagnosis not present

## 2018-11-17 DIAGNOSIS — M329 Systemic lupus erythematosus, unspecified: Secondary | ICD-10-CM | POA: Diagnosis not present

## 2018-11-17 DIAGNOSIS — R768 Other specified abnormal immunological findings in serum: Secondary | ICD-10-CM | POA: Diagnosis not present

## 2018-11-17 DIAGNOSIS — O9989 Other specified diseases and conditions complicating pregnancy, childbirth and the puerperium: Secondary | ICD-10-CM | POA: Diagnosis not present

## 2018-11-19 DIAGNOSIS — I493 Ventricular premature depolarization: Secondary | ICD-10-CM | POA: Diagnosis not present

## 2018-11-29 DIAGNOSIS — Z3403 Encounter for supervision of normal first pregnancy, third trimester: Secondary | ICD-10-CM | POA: Diagnosis not present

## 2018-11-29 DIAGNOSIS — M3219 Other organ or system involvement in systemic lupus erythematosus: Secondary | ICD-10-CM | POA: Diagnosis not present

## 2018-11-29 DIAGNOSIS — I272 Pulmonary hypertension, unspecified: Secondary | ICD-10-CM | POA: Diagnosis not present

## 2018-11-29 DIAGNOSIS — R768 Other specified abnormal immunological findings in serum: Secondary | ICD-10-CM | POA: Diagnosis not present

## 2018-12-03 DIAGNOSIS — I272 Pulmonary hypertension, unspecified: Secondary | ICD-10-CM | POA: Diagnosis not present

## 2018-12-03 DIAGNOSIS — M3219 Other organ or system involvement in systemic lupus erythematosus: Secondary | ICD-10-CM | POA: Diagnosis not present

## 2018-12-03 DIAGNOSIS — Z79899 Other long term (current) drug therapy: Secondary | ICD-10-CM | POA: Diagnosis not present

## 2018-12-03 DIAGNOSIS — Z331 Pregnant state, incidental: Secondary | ICD-10-CM | POA: Diagnosis not present

## 2018-12-06 DIAGNOSIS — Z3403 Encounter for supervision of normal first pregnancy, third trimester: Secondary | ICD-10-CM | POA: Diagnosis not present

## 2018-12-09 DIAGNOSIS — M329 Systemic lupus erythematosus, unspecified: Secondary | ICD-10-CM | POA: Diagnosis not present

## 2018-12-10 DIAGNOSIS — M329 Systemic lupus erythematosus, unspecified: Secondary | ICD-10-CM | POA: Diagnosis not present

## 2018-12-10 DIAGNOSIS — O9989 Other specified diseases and conditions complicating pregnancy, childbirth and the puerperium: Secondary | ICD-10-CM | POA: Diagnosis not present

## 2018-12-10 DIAGNOSIS — O99413 Diseases of the circulatory system complicating pregnancy, third trimester: Secondary | ICD-10-CM | POA: Diagnosis not present

## 2018-12-10 DIAGNOSIS — Z3A35 35 weeks gestation of pregnancy: Secondary | ICD-10-CM | POA: Diagnosis not present

## 2018-12-10 DIAGNOSIS — R768 Other specified abnormal immunological findings in serum: Secondary | ICD-10-CM | POA: Diagnosis not present

## 2018-12-13 DIAGNOSIS — Z3403 Encounter for supervision of normal first pregnancy, third trimester: Secondary | ICD-10-CM | POA: Diagnosis not present

## 2018-12-20 DIAGNOSIS — M3219 Other organ or system involvement in systemic lupus erythematosus: Secondary | ICD-10-CM | POA: Diagnosis not present

## 2018-12-28 DIAGNOSIS — M3219 Other organ or system involvement in systemic lupus erythematosus: Secondary | ICD-10-CM | POA: Diagnosis not present

## 2019-01-02 DIAGNOSIS — O368130 Decreased fetal movements, third trimester, not applicable or unspecified: Secondary | ICD-10-CM | POA: Diagnosis not present

## 2019-01-02 DIAGNOSIS — Z3A38 38 weeks gestation of pregnancy: Secondary | ICD-10-CM | POA: Diagnosis not present

## 2019-01-04 DIAGNOSIS — O9989 Other specified diseases and conditions complicating pregnancy, childbirth and the puerperium: Secondary | ICD-10-CM | POA: Diagnosis not present

## 2019-01-04 DIAGNOSIS — O9912 Other diseases of the blood and blood-forming organs and certain disorders involving the immune mechanism complicating childbirth: Secondary | ICD-10-CM | POA: Diagnosis not present

## 2019-01-04 DIAGNOSIS — M329 Systemic lupus erythematosus, unspecified: Secondary | ICD-10-CM | POA: Diagnosis not present

## 2019-01-04 DIAGNOSIS — Z3A00 Weeks of gestation of pregnancy not specified: Secondary | ICD-10-CM | POA: Diagnosis not present

## 2019-01-04 DIAGNOSIS — Z888 Allergy status to other drugs, medicaments and biological substances status: Secondary | ICD-10-CM | POA: Diagnosis not present

## 2019-01-04 DIAGNOSIS — Z79899 Other long term (current) drug therapy: Secondary | ICD-10-CM | POA: Diagnosis not present

## 2019-01-04 DIAGNOSIS — Z7982 Long term (current) use of aspirin: Secondary | ICD-10-CM | POA: Diagnosis not present

## 2019-01-04 DIAGNOSIS — Z881 Allergy status to other antibiotic agents status: Secondary | ICD-10-CM | POA: Diagnosis not present

## 2019-01-04 DIAGNOSIS — O43893 Other placental disorders, third trimester: Secondary | ICD-10-CM | POA: Diagnosis not present

## 2019-01-04 DIAGNOSIS — Z7952 Long term (current) use of systemic steroids: Secondary | ICD-10-CM | POA: Diagnosis not present

## 2019-01-04 DIAGNOSIS — Z3A38 38 weeks gestation of pregnancy: Secondary | ICD-10-CM | POA: Diagnosis not present

## 2019-01-04 DIAGNOSIS — O99824 Streptococcus B carrier state complicating childbirth: Secondary | ICD-10-CM | POA: Diagnosis not present

## 2019-01-11 ENCOUNTER — Inpatient Hospital Stay (HOSPITAL_COMMUNITY): Payer: BLUE CROSS/BLUE SHIELD

## 2019-01-11 ENCOUNTER — Encounter (HOSPITAL_COMMUNITY): Payer: Self-pay

## 2019-01-11 ENCOUNTER — Inpatient Hospital Stay (HOSPITAL_COMMUNITY)
Admission: AD | Admit: 2019-01-11 | Discharge: 2019-01-11 | Disposition: A | Discharge status: Home / Self Care | Payer: BLUE CROSS/BLUE SHIELD | Attending: Obstetrics and Gynecology | Admitting: Obstetrics and Gynecology

## 2019-01-11 DIAGNOSIS — N939 Abnormal uterine and vaginal bleeding, unspecified: Secondary | ICD-10-CM | POA: Diagnosis not present

## 2019-01-11 DIAGNOSIS — Z880 Allergy status to penicillin: Secondary | ICD-10-CM | POA: Insufficient documentation

## 2019-01-11 HISTORY — DX: Systemic lupus erythematosus, unspecified: M32.9

## 2019-01-11 HISTORY — DX: Reserved for concepts with insufficient information to code with codable children: IMO0002

## 2019-01-11 LAB — CBC
HCT: 37.9 % (ref 36.0–46.0)
Hemoglobin: 12.3 g/dL (ref 12.0–15.0)
MCH: 33.4 pg (ref 26.0–34.0)
MCHC: 32.5 g/dL (ref 30.0–36.0)
MCV: 103 fL — ABNORMAL HIGH (ref 80.0–100.0)
Platelets: 313 10*3/uL (ref 150–400)
RBC: 3.68 MIL/uL — ABNORMAL LOW (ref 3.87–5.11)
RDW: 14.7 % (ref 11.5–15.5)
WBC: 10.8 10*3/uL — ABNORMAL HIGH (ref 4.0–10.5)
nRBC: 0 % (ref 0.0–0.2)

## 2019-01-11 MED ORDER — OXYCODONE-ACETAMINOPHEN 5-325 MG PO TABS: 1.0000 | ORAL_TABLET | ORAL | 0 refills | Status: AC | PRN

## 2019-01-11 MED ORDER — MISOPROSTOL 200 MCG PO TABS
800.0000 ug | ORAL_TABLET | Freq: Once | ORAL | Status: AC
  Administered 2019-01-11: 800 ug via ORAL
  Filled 2019-01-11: qty 4

## 2019-01-11 NOTE — MAU Note (Signed)
Pt 6 days PP, having heavy bleeding, started about an hour ago after pumping. Soaking pads every 30 minutes. Having large clots, feels like a clot is still in her vagina and didn't come out.

## 2019-01-11 NOTE — Discharge Instructions (Signed)

## 2019-01-11 NOTE — MAU Provider Note (Signed)
History     CSN: [id number]  Arrival date and time: 01/11/19 1517   First Provider Initiated Contact with Patient 01/11/19 1604      Chief Complaint  Patient presents with  . Abdominal Pain  . Vaginal Bleeding   G1P1001 s/p SVD 6 days ago presenting with heavy VB. Bleeding increased last night. Today she saturated a pad in 1 hr, for 3 consecutive hrs and passing large clots. Denies abdominal or pelvic pain. Not using tampons or anything pv. Denies fevers. No urinary sx. Having BMs. She admits to increased physical activity yesterday.   Past Medical History:  Diagnosis Date  . Lupus [facility])     Past Surgical History:  Procedure Laterality Date  . BREAST REDUCTION SURGERY    . HIP SURGERY    . SMALL INTESTINE SURGERY    . TONSILLECTOMY      No family history on file.  Social History   Tobacco Use  . Smoking status: Never Smoker  . Smokeless tobacco: Never Used  Substance Use Topics  . Alcohol use: Not Currently  . Drug use: Never    Allergies:  Allergies  Allergen Reactions  . Amoxicillin Nausea And Vomiting  . Compazine [Prochlorperazine Edisylate]     Red man syndrome    No medications prior to admission.    Review of Systems  Constitutional: Negative for chills and fever.  Gastrointestinal: Negative for abdominal pain.  Genitourinary: Positive for vaginal bleeding. Negative for pelvic pain.   Physical Exam   Blood pressure 119/84, pulse (!) 112, temperature 98.5 F (36.9 C), temperature source Oral, resp. rate 18, weight 59 kg, SpO2 97 %.  Physical Exam  Nursing note and vitals reviewed. Constitutional: She is oriented to person, place, and time. She appears well-developed and well-nourished. No distress.  HENT:  Head: Normocephalic and atraumatic.  Neck: Normal range of motion.  Respiratory: Effort normal. No respiratory distress.  GI: Soft. She exhibits no distension and no mass. There is no abdominal tenderness. There is no rebound and no  guarding.  Genitourinary:    Genitourinary Comments: External: no lesions or erythema, perineum well approximated, mild edema Vagina: rugated, pink, moist, small bloody discharge, cleared with 2 fox swabs Uterus: + enlarged, anteverted, non tender, no CMT Adnexae: no masses, no tenderness left, no tenderness right Cervix closed    Musculoskeletal: Normal range of motion.  Neurological: She is alert and oriented to person, place, and time.  Skin: Skin is warm and dry.  Psychiatric: She has a normal mood and affect.   Results for orders placed or performed during the hospital encounter of 01/11/19 (from the past 24 hour(s))  CBC     Status: Abnormal   Collection Time: 01/11/19  3:53 PM  Result Value Ref Range   WBC 10.8 (H) 4.0 - 10.5 K/uL   RBC 3.68 (L) 3.87 - 5.11 MIL/uL   Hemoglobin 12.3 12.0 - 15.0 g/dL   HCT 43.5 68.6 - 16.8 %   MCV 103.0 (H) 80.0 - 100.0 fL   MCH 33.4 26.0 - 34.0 pg   MCHC 32.5 30.0 - 36.0 g/dL   RDW 37.2 90.2 - 11.1 %   Platelets 313 150 - 400 K/uL   nRBC 0.0 0.0 - 0.2 %   US Pelvis (transabdominal Only)  Result Date: 01/11/2019 CLINICAL DATA:  Six days postpartum. Heavy bleeding with clots this afternoon. EXAM: TRANSABDOMINAL ULTRASOUND OF PELVIS TECHNIQUE: Transabdominal ultrasound examination of the pelvis was performed including evaluation of the uterus, ovaries,  adnexal regions, and pelvic cul-de-sac. COMPARISON:  None. FINDINGS: Uterus Measurements: 12.0 x 7.4 x 9.3 centimeters = volume: 430.1 mL. Myometrium is slightly heterogeneous, consistent with postpartum state. Endometrium Thickness: Thickened and heterogeneous endometrial stripe, measuring 2.6 centimeters. With Doppler evaluation a portion of a endometrial contents is vascular. Right ovary Measurements: 3.4 x 1.2 x 2.5 centimeters. Normal appearance/no adnexal mass. Left ovary Measurements: The ovary is not visualized, either absent or obscured Other findings:  No free pelvic fluid. IMPRESSION: 1.  Enlarged, postpartum uterus. 2. Heterogeneous endometrial contents consistent with retained products of conception. 3. Normal appearance of the RIGHT ovary; nonvisualized LEFT ovary. Electronically Signed   By: Norva Pavlov M.D.   On: 01/11/2019 17:05   MAU Course  Procedures Orders Placed This Encounter  Procedures  . US PELVIS (TRANSABDOMINAL ONLY)    Standing Status:   Standing    Number of Occurrences:   1    Order Specific Question:   Symptom/Reason for Exam    Answer:   Excessive postpartum bleeding [[id number]]  . CBC    Standing Status:   Standing    Number of Occurrences:   1  . Discharge patient    Order Specific Question:   Discharge disposition    Answer:   01-Home or Self Care [1]    Order Specific Question:   Discharge patient date    Answer:   01/11/2019   Meds ordered this encounter  Medications  . misoprostol (CYTOTEC) tablet 800 mcg  . oxyCODONE-acetaminophen (PERCOCET/ROXICET) 5-325 MG tablet    Sig: Take 1 tablet by mouth every 4 (four) hours as needed for severe pain.    Dispense:  6 tablet    Refill:  0    Order Specific Question:   Supervising Provider    Answer:   Levie Heritage [[id number]]   MDM Chart review: no record on file, not able to access care everywhere d/t pt chart being merged. Per pt she delivered at [facility] in [place], had uncomplicated pregnancy and SVD with 2nd degree laceration, hx Lupus. She was seen by Dr. Elon Spanner yesterday for pp visit. [place] consistent with retained POCs. No evidence of endometritis or PPH, Hgb stable. Consult with Dr. Adrian Blackwater, recommend consult with primary OB. Consult with Dr. Vincente Poli, recommend Cytotec and f/u in office tomorrow. Discussed plan with pt. Stable for discharge home.   Assessment and Plan   1. Retained products of conception after delivery without hemorrhage   2. Excessive postpartum bleeding    Discharge home Follow up with Dr. Renaldo Fiddler tomorrow in office- message left on voicemail Bleeding  precautions Rx Cytotec Rx Percocet Continue Ibuprofen 800 mg prn  Allergies as of 01/11/2019      Reactions   Amoxicillin Nausea And Vomiting   Compazine [prochlorperazine Edisylate]    Red man syndrome      Medication List    TAKE these medications   oxyCODONE-acetaminophen 5-325 MG tablet Commonly known as:  PERCOCET/ROXICET Take 1 tablet by mouth every 4 (four) hours as needed for severe pain.      Donette Larry, CNM 01/11/2019, 5:40 PM

## 2019-01-12 ENCOUNTER — Encounter: Payer: Self-pay | Admitting: Neurology

## 2019-01-12 DIAGNOSIS — D509 Iron deficiency anemia, unspecified: Secondary | ICD-10-CM | POA: Diagnosis not present

## 2019-01-12 DIAGNOSIS — O99019 Anemia complicating pregnancy, unspecified trimester: Secondary | ICD-10-CM | POA: Diagnosis not present

## 2019-01-27 DIAGNOSIS — O030 Genital tract and pelvic infection following incomplete spontaneous abortion: Secondary | ICD-10-CM | POA: Diagnosis not present

## 2019-02-28 DIAGNOSIS — M329 Systemic lupus erythematosus, unspecified: Secondary | ICD-10-CM | POA: Diagnosis not present

## 2019-03-02 DIAGNOSIS — Z79899 Other long term (current) drug therapy: Secondary | ICD-10-CM | POA: Diagnosis not present

## 2019-03-02 DIAGNOSIS — M0800 Unspecified juvenile rheumatoid arthritis of unspecified site: Secondary | ICD-10-CM | POA: Diagnosis not present

## 2019-03-02 DIAGNOSIS — M329 Systemic lupus erythematosus, unspecified: Secondary | ICD-10-CM | POA: Diagnosis not present

## 2019-04-01 DIAGNOSIS — L309 Dermatitis, unspecified: Secondary | ICD-10-CM | POA: Diagnosis not present

## 2019-06-01 DIAGNOSIS — M329 Systemic lupus erythematosus, unspecified: Secondary | ICD-10-CM | POA: Diagnosis not present

## 2019-06-13 DIAGNOSIS — I2721 Secondary pulmonary arterial hypertension: Secondary | ICD-10-CM | POA: Diagnosis not present

## 2019-06-22 DIAGNOSIS — M329 Systemic lupus erythematosus, unspecified: Secondary | ICD-10-CM | POA: Diagnosis not present

## 2019-07-01 DIAGNOSIS — N3000 Acute cystitis without hematuria: Secondary | ICD-10-CM | POA: Diagnosis not present

## 2019-07-01 DIAGNOSIS — R109 Unspecified abdominal pain: Secondary | ICD-10-CM | POA: Diagnosis not present

## 2019-07-20 DIAGNOSIS — B379 Candidiasis, unspecified: Secondary | ICD-10-CM | POA: Diagnosis not present

## 2019-07-20 DIAGNOSIS — H02051 Trichiasis without entropian right upper eyelid: Secondary | ICD-10-CM | POA: Diagnosis not present

## 2019-07-20 DIAGNOSIS — R300 Dysuria: Secondary | ICD-10-CM | POA: Diagnosis not present

## 2019-07-20 DIAGNOSIS — M3210 Systemic lupus erythematosus, organ or system involvement unspecified: Secondary | ICD-10-CM | POA: Diagnosis not present

## 2019-07-20 DIAGNOSIS — H5203 Hypermetropia, bilateral: Secondary | ICD-10-CM | POA: Diagnosis not present

## 2019-07-20 DIAGNOSIS — Z79899 Other long term (current) drug therapy: Secondary | ICD-10-CM | POA: Diagnosis not present

## 2019-07-20 DIAGNOSIS — N3000 Acute cystitis without hematuria: Secondary | ICD-10-CM | POA: Diagnosis not present

## 2019-07-21 DIAGNOSIS — L820 Inflamed seborrheic keratosis: Secondary | ICD-10-CM | POA: Diagnosis not present

## 2019-07-21 DIAGNOSIS — D1801 Hemangioma of skin and subcutaneous tissue: Secondary | ICD-10-CM | POA: Diagnosis not present

## 2019-07-21 DIAGNOSIS — D225 Melanocytic nevi of trunk: Secondary | ICD-10-CM | POA: Diagnosis not present

## 2019-07-21 DIAGNOSIS — D2230 Melanocytic nevi of unspecified part of face: Secondary | ICD-10-CM | POA: Diagnosis not present

## 2019-07-21 DIAGNOSIS — L814 Other melanin hyperpigmentation: Secondary | ICD-10-CM | POA: Diagnosis not present

## 2019-08-09 DIAGNOSIS — K219 Gastro-esophageal reflux disease without esophagitis: Secondary | ICD-10-CM | POA: Diagnosis not present

## 2019-09-28 DIAGNOSIS — M25572 Pain in left ankle and joints of left foot: Secondary | ICD-10-CM | POA: Diagnosis not present

## 2019-09-28 DIAGNOSIS — M542 Cervicalgia: Secondary | ICD-10-CM | POA: Diagnosis not present

## 2019-09-28 DIAGNOSIS — Z23 Encounter for immunization: Secondary | ICD-10-CM | POA: Diagnosis not present

## 2019-09-28 DIAGNOSIS — G8929 Other chronic pain: Secondary | ICD-10-CM | POA: Diagnosis not present

## 2019-09-28 DIAGNOSIS — M25519 Pain in unspecified shoulder: Secondary | ICD-10-CM | POA: Diagnosis not present

## 2019-09-28 DIAGNOSIS — M329 Systemic lupus erythematosus, unspecified: Secondary | ICD-10-CM | POA: Diagnosis not present

## 2019-09-28 DIAGNOSIS — M25571 Pain in right ankle and joints of right foot: Secondary | ICD-10-CM | POA: Diagnosis not present

## 2019-10-13 DIAGNOSIS — M79604 Pain in right leg: Secondary | ICD-10-CM | POA: Diagnosis not present

## 2019-10-13 DIAGNOSIS — M25572 Pain in left ankle and joints of left foot: Secondary | ICD-10-CM | POA: Diagnosis not present

## 2019-10-13 DIAGNOSIS — M25571 Pain in right ankle and joints of right foot: Secondary | ICD-10-CM | POA: Diagnosis not present

## 2019-10-13 DIAGNOSIS — M79605 Pain in left leg: Secondary | ICD-10-CM | POA: Diagnosis not present

## 2019-12-01 DIAGNOSIS — Z20828 Contact with and (suspected) exposure to other viral communicable diseases: Secondary | ICD-10-CM | POA: Diagnosis not present

## 2020-01-02 DIAGNOSIS — R197 Diarrhea, unspecified: Secondary | ICD-10-CM | POA: Diagnosis not present

## 2020-01-02 DIAGNOSIS — K219 Gastro-esophageal reflux disease without esophagitis: Secondary | ICD-10-CM | POA: Diagnosis not present

## 2020-01-02 DIAGNOSIS — R1013 Epigastric pain: Secondary | ICD-10-CM | POA: Diagnosis not present

## 2020-01-02 DIAGNOSIS — R110 Nausea: Secondary | ICD-10-CM | POA: Diagnosis not present

## 2020-01-03 DIAGNOSIS — Z6821 Body mass index (BMI) 21.0-21.9, adult: Secondary | ICD-10-CM | POA: Diagnosis not present

## 2020-01-03 DIAGNOSIS — Z01419 Encounter for gynecological examination (general) (routine) without abnormal findings: Secondary | ICD-10-CM | POA: Diagnosis not present

## 2020-01-04 DIAGNOSIS — Z01419 Encounter for gynecological examination (general) (routine) without abnormal findings: Secondary | ICD-10-CM | POA: Diagnosis not present

## 2020-01-25 DIAGNOSIS — R197 Diarrhea, unspecified: Secondary | ICD-10-CM | POA: Diagnosis not present

## 2020-01-25 DIAGNOSIS — R110 Nausea: Secondary | ICD-10-CM | POA: Diagnosis not present

## 2020-01-27 DIAGNOSIS — R197 Diarrhea, unspecified: Secondary | ICD-10-CM | POA: Diagnosis not present

## 2020-02-01 DIAGNOSIS — Z79899 Other long term (current) drug therapy: Secondary | ICD-10-CM | POA: Diagnosis not present

## 2020-02-01 DIAGNOSIS — M329 Systemic lupus erythematosus, unspecified: Secondary | ICD-10-CM | POA: Diagnosis not present

## 2020-02-10 DIAGNOSIS — M329 Systemic lupus erythematosus, unspecified: Secondary | ICD-10-CM | POA: Diagnosis not present

## 2020-02-10 DIAGNOSIS — Z79899 Other long term (current) drug therapy: Secondary | ICD-10-CM | POA: Diagnosis not present

## 2020-03-08 DIAGNOSIS — N926 Irregular menstruation, unspecified: Secondary | ICD-10-CM | POA: Diagnosis not present

## 2020-03-22 DIAGNOSIS — N926 Irregular menstruation, unspecified: Secondary | ICD-10-CM | POA: Diagnosis not present

## 2020-04-05 DIAGNOSIS — Z3201 Encounter for pregnancy test, result positive: Secondary | ICD-10-CM | POA: Diagnosis not present

## 2020-04-09 DIAGNOSIS — M329 Systemic lupus erythematosus, unspecified: Secondary | ICD-10-CM | POA: Diagnosis not present

## 2020-04-24 DIAGNOSIS — Z3201 Encounter for pregnancy test, result positive: Secondary | ICD-10-CM | POA: Diagnosis not present

## 2020-04-24 DIAGNOSIS — O09891 Supervision of other high risk pregnancies, first trimester: Secondary | ICD-10-CM | POA: Diagnosis not present

## 2020-04-24 DIAGNOSIS — Z369 Encounter for antenatal screening, unspecified: Secondary | ICD-10-CM | POA: Diagnosis not present

## 2020-04-24 DIAGNOSIS — Z3A01 Less than 8 weeks gestation of pregnancy: Secondary | ICD-10-CM | POA: Diagnosis not present

## 2020-04-24 DIAGNOSIS — Z6821 Body mass index (BMI) 21.0-21.9, adult: Secondary | ICD-10-CM | POA: Diagnosis not present

## 2020-04-24 DIAGNOSIS — O3680X0 Pregnancy with inconclusive fetal viability, not applicable or unspecified: Secondary | ICD-10-CM | POA: Diagnosis not present

## 2020-04-24 DIAGNOSIS — Z3689 Encounter for other specified antenatal screening: Secondary | ICD-10-CM | POA: Diagnosis not present

## 2020-05-08 DIAGNOSIS — Z7952 Long term (current) use of systemic steroids: Secondary | ICD-10-CM | POA: Diagnosis not present

## 2020-05-08 DIAGNOSIS — Z3A09 9 weeks gestation of pregnancy: Secondary | ICD-10-CM | POA: Diagnosis not present

## 2020-05-08 DIAGNOSIS — R768 Other specified abnormal immunological findings in serum: Secondary | ICD-10-CM | POA: Diagnosis not present

## 2020-05-08 DIAGNOSIS — O99891 Other specified diseases and conditions complicating pregnancy: Secondary | ICD-10-CM | POA: Diagnosis not present

## 2020-05-14 DIAGNOSIS — Z369 Encounter for antenatal screening, unspecified: Secondary | ICD-10-CM | POA: Diagnosis not present

## 2020-05-14 DIAGNOSIS — Z36 Encounter for antenatal screening for chromosomal anomalies: Secondary | ICD-10-CM | POA: Diagnosis not present

## 2020-05-14 DIAGNOSIS — Z3A10 10 weeks gestation of pregnancy: Secondary | ICD-10-CM | POA: Diagnosis not present

## 2020-05-14 DIAGNOSIS — Z113 Encounter for screening for infections with a predominantly sexual mode of transmission: Secondary | ICD-10-CM | POA: Diagnosis not present

## 2020-05-14 DIAGNOSIS — O09891 Supervision of other high risk pregnancies, first trimester: Secondary | ICD-10-CM | POA: Diagnosis not present

## 2020-05-25 DIAGNOSIS — O351XX0 Maternal care for (suspected) chromosomal abnormality in fetus, not applicable or unspecified: Secondary | ICD-10-CM | POA: Diagnosis not present

## 2020-05-25 DIAGNOSIS — O281 Abnormal biochemical finding on antenatal screening of mother: Secondary | ICD-10-CM | POA: Diagnosis not present

## 2020-05-25 DIAGNOSIS — Z3A12 12 weeks gestation of pregnancy: Secondary | ICD-10-CM | POA: Diagnosis not present

## 2020-06-05 DIAGNOSIS — Q909 Down syndrome, unspecified: Secondary | ICD-10-CM | POA: Diagnosis not present

## 2020-06-05 DIAGNOSIS — Z01818 Encounter for other preprocedural examination: Secondary | ICD-10-CM | POA: Diagnosis not present

## 2020-06-07 DIAGNOSIS — Q909 Down syndrome, unspecified: Secondary | ICD-10-CM | POA: Diagnosis not present

## 2020-06-07 DIAGNOSIS — O99411 Diseases of the circulatory system complicating pregnancy, first trimester: Secondary | ICD-10-CM | POA: Diagnosis not present

## 2020-06-07 DIAGNOSIS — Z79899 Other long term (current) drug therapy: Secondary | ICD-10-CM | POA: Diagnosis not present

## 2020-06-07 DIAGNOSIS — O351XX0 Maternal care for (suspected) chromosomal abnormality in fetus, not applicable or unspecified: Secondary | ICD-10-CM | POA: Diagnosis not present

## 2020-06-07 DIAGNOSIS — N854 Malposition of uterus: Secondary | ICD-10-CM | POA: Diagnosis not present

## 2020-06-07 DIAGNOSIS — M329 Systemic lupus erythematosus, unspecified: Secondary | ICD-10-CM | POA: Diagnosis not present

## 2020-06-07 DIAGNOSIS — M797 Fibromyalgia: Secondary | ICD-10-CM | POA: Diagnosis not present

## 2020-06-07 DIAGNOSIS — O021 Missed abortion: Secondary | ICD-10-CM | POA: Diagnosis not present

## 2020-06-07 DIAGNOSIS — Z3A13 13 weeks gestation of pregnancy: Secondary | ICD-10-CM | POA: Diagnosis not present

## 2020-06-07 DIAGNOSIS — O99891 Other specified diseases and conditions complicating pregnancy: Secondary | ICD-10-CM | POA: Diagnosis not present

## 2020-06-07 DIAGNOSIS — O364XX0 Maternal care for intrauterine death, not applicable or unspecified: Secondary | ICD-10-CM | POA: Diagnosis not present

## 2020-06-15 DIAGNOSIS — O351XX0 Maternal care for (suspected) chromosomal abnormality in fetus, not applicable or unspecified: Secondary | ICD-10-CM | POA: Diagnosis not present

## 2020-06-15 DIAGNOSIS — Z31430 Encounter of female for testing for genetic disease carrier status for procreative management: Secondary | ICD-10-CM | POA: Diagnosis not present

## 2020-06-21 DIAGNOSIS — Q909 Down syndrome, unspecified: Secondary | ICD-10-CM | POA: Diagnosis not present

## 2020-06-25 DIAGNOSIS — M329 Systemic lupus erythematosus, unspecified: Secondary | ICD-10-CM | POA: Diagnosis not present

## 2020-06-29 DIAGNOSIS — M329 Systemic lupus erythematosus, unspecified: Secondary | ICD-10-CM | POA: Diagnosis not present

## 2020-06-29 DIAGNOSIS — M25552 Pain in left hip: Secondary | ICD-10-CM | POA: Diagnosis not present

## 2020-06-29 DIAGNOSIS — M25551 Pain in right hip: Secondary | ICD-10-CM | POA: Diagnosis not present

## 2020-07-16 DIAGNOSIS — N6459 Other signs and symptoms in breast: Secondary | ICD-10-CM | POA: Diagnosis not present

## 2020-07-16 DIAGNOSIS — L309 Dermatitis, unspecified: Secondary | ICD-10-CM | POA: Diagnosis not present

## 2020-07-18 DIAGNOSIS — M25551 Pain in right hip: Secondary | ICD-10-CM | POA: Diagnosis not present

## 2020-07-18 DIAGNOSIS — M25552 Pain in left hip: Secondary | ICD-10-CM | POA: Diagnosis not present

## 2020-07-18 DIAGNOSIS — M329 Systemic lupus erythematosus, unspecified: Secondary | ICD-10-CM | POA: Diagnosis not present

## 2020-07-23 DIAGNOSIS — Z79899 Other long term (current) drug therapy: Secondary | ICD-10-CM | POA: Diagnosis not present

## 2020-07-23 DIAGNOSIS — M3210 Systemic lupus erythematosus, organ or system involvement unspecified: Secondary | ICD-10-CM | POA: Diagnosis not present

## 2020-07-26 DIAGNOSIS — Z3202 Encounter for pregnancy test, result negative: Secondary | ICD-10-CM | POA: Diagnosis not present

## 2020-08-24 DIAGNOSIS — R29898 Other symptoms and signs involving the musculoskeletal system: Secondary | ICD-10-CM | POA: Diagnosis not present

## 2020-08-24 DIAGNOSIS — M25852 Other specified joint disorders, left hip: Secondary | ICD-10-CM | POA: Diagnosis not present

## 2020-08-24 DIAGNOSIS — M25851 Other specified joint disorders, right hip: Secondary | ICD-10-CM | POA: Diagnosis not present

## 2020-08-28 DIAGNOSIS — M545 Low back pain: Secondary | ICD-10-CM | POA: Diagnosis not present

## 2020-08-28 DIAGNOSIS — M25552 Pain in left hip: Secondary | ICD-10-CM | POA: Diagnosis not present

## 2020-08-28 DIAGNOSIS — M25551 Pain in right hip: Secondary | ICD-10-CM | POA: Diagnosis not present

## 2020-09-06 DIAGNOSIS — M25551 Pain in right hip: Secondary | ICD-10-CM | POA: Diagnosis not present

## 2020-09-06 DIAGNOSIS — M545 Low back pain: Secondary | ICD-10-CM | POA: Diagnosis not present

## 2020-09-06 DIAGNOSIS — M25552 Pain in left hip: Secondary | ICD-10-CM | POA: Diagnosis not present

## 2020-09-17 DIAGNOSIS — M25551 Pain in right hip: Secondary | ICD-10-CM | POA: Diagnosis not present

## 2020-09-17 DIAGNOSIS — M545 Low back pain, unspecified: Secondary | ICD-10-CM | POA: Diagnosis not present

## 2020-09-17 DIAGNOSIS — M25552 Pain in left hip: Secondary | ICD-10-CM | POA: Diagnosis not present

## 2020-09-27 DIAGNOSIS — M545 Low back pain, unspecified: Secondary | ICD-10-CM | POA: Diagnosis not present

## 2020-09-27 DIAGNOSIS — M25551 Pain in right hip: Secondary | ICD-10-CM | POA: Diagnosis not present

## 2020-09-27 DIAGNOSIS — M25552 Pain in left hip: Secondary | ICD-10-CM | POA: Diagnosis not present

## 2020-10-09 DIAGNOSIS — M25551 Pain in right hip: Secondary | ICD-10-CM | POA: Diagnosis not present

## 2020-10-09 DIAGNOSIS — M545 Low back pain, unspecified: Secondary | ICD-10-CM | POA: Diagnosis not present

## 2020-10-09 DIAGNOSIS — M25552 Pain in left hip: Secondary | ICD-10-CM | POA: Diagnosis not present

## 2020-10-24 DIAGNOSIS — O0991 Supervision of high risk pregnancy, unspecified, first trimester: Secondary | ICD-10-CM | POA: Diagnosis not present

## 2020-10-24 DIAGNOSIS — Z3A01 Less than 8 weeks gestation of pregnancy: Secondary | ICD-10-CM | POA: Diagnosis not present

## 2020-10-24 DIAGNOSIS — Z3689 Encounter for other specified antenatal screening: Secondary | ICD-10-CM | POA: Diagnosis not present

## 2020-10-29 DIAGNOSIS — Z3A34 34 weeks gestation of pregnancy: Secondary | ICD-10-CM | POA: Diagnosis not present

## 2020-10-29 DIAGNOSIS — O0993 Supervision of high risk pregnancy, unspecified, third trimester: Secondary | ICD-10-CM | POA: Diagnosis not present

## 2020-10-29 DIAGNOSIS — O0991 Supervision of high risk pregnancy, unspecified, first trimester: Secondary | ICD-10-CM | POA: Diagnosis not present

## 2020-10-30 DIAGNOSIS — O0991 Supervision of high risk pregnancy, unspecified, first trimester: Secondary | ICD-10-CM | POA: Diagnosis not present

## 2020-10-30 DIAGNOSIS — M329 Systemic lupus erythematosus, unspecified: Secondary | ICD-10-CM | POA: Diagnosis not present

## 2020-10-30 DIAGNOSIS — O280 Abnormal hematological finding on antenatal screening of mother: Secondary | ICD-10-CM | POA: Diagnosis not present

## 2020-10-30 DIAGNOSIS — M0880 Other juvenile arthritis, unspecified site: Secondary | ICD-10-CM | POA: Diagnosis not present

## 2020-10-30 DIAGNOSIS — Q909 Down syndrome, unspecified: Secondary | ICD-10-CM | POA: Diagnosis not present

## 2020-10-30 DIAGNOSIS — Z23 Encounter for immunization: Secondary | ICD-10-CM | POA: Diagnosis not present

## 2020-10-31 ENCOUNTER — Inpatient Hospital Stay (HOSPITAL_COMMUNITY)
Admission: AD | Admit: 2020-10-31 | Discharge: 2020-10-31 | Disposition: A | Discharge status: Home / Self Care | Payer: BC Managed Care – PPO | Attending: Obstetrics and Gynecology | Admitting: Obstetrics and Gynecology

## 2020-10-31 ENCOUNTER — Other Ambulatory Visit: Payer: Self-pay

## 2020-10-31 ENCOUNTER — Inpatient Hospital Stay (HOSPITAL_COMMUNITY): Payer: BC Managed Care – PPO

## 2020-10-31 ENCOUNTER — Encounter (HOSPITAL_COMMUNITY): Payer: Self-pay | Admitting: Obstetrics and Gynecology

## 2020-10-31 DIAGNOSIS — Z6790 Unspecified blood type, Rh positive: Secondary | ICD-10-CM | POA: Diagnosis not present

## 2020-10-31 DIAGNOSIS — O208 Other hemorrhage in early pregnancy: Secondary | ICD-10-CM | POA: Diagnosis not present

## 2020-10-31 DIAGNOSIS — N939 Abnormal uterine and vaginal bleeding, unspecified: Secondary | ICD-10-CM | POA: Diagnosis not present

## 2020-10-31 DIAGNOSIS — Z3A01 Less than 8 weeks gestation of pregnancy: Secondary | ICD-10-CM | POA: Diagnosis not present

## 2020-10-31 DIAGNOSIS — O468X1 Other antepartum hemorrhage, first trimester: Secondary | ICD-10-CM

## 2020-10-31 DIAGNOSIS — O418X10 Other specified disorders of amniotic fluid and membranes, first trimester, not applicable or unspecified: Secondary | ICD-10-CM

## 2020-10-31 DIAGNOSIS — O26891 Other specified pregnancy related conditions, first trimester: Secondary | ICD-10-CM | POA: Diagnosis not present

## 2020-10-31 DIAGNOSIS — O4690 Antepartum hemorrhage, unspecified, unspecified trimester: Secondary | ICD-10-CM

## 2020-10-31 LAB — CBC
HCT: 37.6 % (ref 36.0–46.0)
Hemoglobin: 12.3 g/dL (ref 12.0–15.0)
MCH: 35.3 pg — ABNORMAL HIGH (ref 26.0–34.0)
MCHC: 32.7 g/dL (ref 30.0–36.0)
MCV: 108 fL — ABNORMAL HIGH (ref 80.0–100.0)
Platelets: 173 10*3/uL (ref 150–400)
RBC: 3.48 MIL/uL — ABNORMAL LOW (ref 3.87–5.11)
RDW: 12.3 % (ref 11.5–15.5)
WBC: 4.8 10*3/uL (ref 4.0–10.5)
nRBC: 0 % (ref 0.0–0.2)

## 2020-10-31 LAB — URINALYSIS, ROUTINE W REFLEX MICROSCOPIC
Bilirubin Urine: NEGATIVE
Glucose, UA: NEGATIVE mg/dL
Ketones, ur: NEGATIVE mg/dL
Leukocytes,Ua: NEGATIVE
Nitrite: NEGATIVE
Protein, ur: NEGATIVE mg/dL
Specific Gravity, Urine: 1.019 (ref 1.005–1.030)
pH: 7 (ref 5.0–8.0)

## 2020-10-31 LAB — WET PREP, GENITAL
Clue Cells Wet Prep HPF POC: NONE SEEN
Sperm: NONE SEEN
Trich, Wet Prep: NONE SEEN
Yeast Wet Prep HPF POC: NONE SEEN

## 2020-10-31 LAB — HCG, QUANTITATIVE, PREGNANCY: hCG, Beta Chain, Quant, S: 138913 m[IU]/mL — ABNORMAL HIGH (ref <5)

## 2020-10-31 NOTE — MAU Note (Signed)
Pt reporting vaginal bleeding this morning in her underwear and and in toilet.  Has had some mild period-like cramping. Denies clots. Was at doctor yesterday and did self-swab. Saw some blood then, but no bleeding. Did get a transvaginal ultrasound yesterday.  Lives in [place] but visiting [place] for holiday.

## 2020-10-31 NOTE — Discharge Instructions (Signed)

## 2020-10-31 NOTE — MAU Provider Note (Signed)
History     CSN: [id number]  Arrival date and time: 10/31/20 1059   First Provider Initiated Contact with Patient 10/31/20 1206      Chief Complaint  Patient presents with  . Vaginal Bleeding   [name] is a 29 y.o. G3P1011 at 107w4d who presents to MAU for vaginal bleeding which began yesterday at her [facility] visit at [facility]. Patient reports she had blood after performing self-swabs at her visit yesterday, and then had another episode of bleeding around 1030AM after using the restroom. Patient reports there was blood on the toilet paper and blood on her underwear - patient showed underwear to her provider and it was a small amount. Patient reports minimal bleeding when wiping when using restroom in MAU and reports no bleeding since that time.  Passing blood clots? no Blood soaking clothes? no Lightheaded/dizzy? no Significant pelvic pain or cramping? Slight cramping Passed any tissue? no  Current pregnancy problems? See list in chart Blood Type? unknown Allergies? AMOX, Compazine Current medications? Plaquenil, Sulfasalazine, azathioprine, prednisone, PNVs Current PNC & next appt? [facility], 12 weeks  Pt denies vaginal discharge/odor/itching. Pt denies N/V, abdominal pain, constipation, diarrhea, or urinary problems. Pt denies fever, chills, fatigue, sweating or changes in appetite. Pt denies SOB or chest pain. Pt denies dizziness, HA, light-headedness, weakness.   OB History    Gravida  3   Para  1   Term  1   Preterm  0   AB  1   Living  1     SAB  0   TAB  0   Ectopic  0   Multiple      Live Births  1           Past Medical History:  Diagnosis Date  . Abdominal distention   . Abdominal pain   . Chest pain   . Chills with fever   . Cough   . Diarrhea 08/13/11  . Diarrhea   . Fatigue   . Fibromyalgia   . GERD (gastroesophageal reflux disease)    since birth  . History of migraine headaches   . IBS (irritable bowel syndrome)   . JRA  (juvenile rheumatoid arthritis) (HCC)   . Juvenile rheumatoid arthritis (HCC)   . Lupus (HCC)   . Lupus (systemic lupus erythematosus) (HCC)   . Migraines   . Nausea and vomiting   . Personal history of methicillin resistant Staphylococcus aureus   . Skin abscess    breast   . Stress headaches   . Unspecified sinusitis (chronic) 08/13/11  . Visual disturbances    eyes getting weaker and blurred vision   . Weakness generalized   . Weight loss, unintentional   . Wheezing     Past Surgical History:  Procedure Laterality Date  . BREAST REDUCTION SURGERY    . BREAST SURGERY  July 2010   breast reduction   . BREAST SURGERY  December 2010   fix breast reduction   . HIP SURGERY    . SMALL INTESTINE SURGERY    . TONSILLECTOMY    . TONSILLECTOMY AND ADENOIDECTOMY  age 50    Family History  Problem Relation Age of Onset  . Crohn's disease Brother   . Migraines Brother     Social History   Tobacco Use  . Smoking status: Never Smoker  . Smokeless tobacco: Never Used  Substance Use Topics  . Alcohol use: Not Currently    Comment: occas.  . Drug use:  Never    Allergies:  Allergies  Allergen Reactions  . Amoxicillin Nausea Only  . Amoxicillin Nausea And Vomiting  . Compazine [Prochlorperazine Edisylate]     Red man syndrome  . Compazine [Prochlorperazine] Other (See Comments)    Makes me crazy    No medications prior to admission.    Review of Systems  Constitutional: Negative for chills, diaphoresis, fatigue and fever.  Eyes: Negative for visual disturbance.  Respiratory: Negative for shortness of breath.   Cardiovascular: Negative for chest pain.  Gastrointestinal: Negative for abdominal pain, constipation, diarrhea, nausea and vomiting.  Genitourinary: Positive for vaginal bleeding. Negative for dysuria, flank pain, frequency, pelvic pain, urgency and vaginal discharge.  Neurological: Negative for dizziness, weakness, light-headedness and headaches.   Physical  Exam   Blood pressure 118/72, pulse 94, temperature 98.3 F (36.8 C), temperature source Oral, resp. rate 17, height 5\' 2"  (1.575 m), weight 56 kg, SpO2 98 %.  Patient Vitals for the past 24 hrs:  BP Temp Temp src Pulse Resp SpO2 Height Weight  10/31/20 1319 118/72 -- -- -- 17 98 % -- --  10/31/20 1145 111/70 -- -- 94 -- -- -- --  10/31/20 1115 113/80 98.3 F (36.8 C) Oral 91 18 99 % 5\' 2"  (1.575 m) 56 kg   Physical Exam Vitals and nursing note reviewed.  Constitutional:      General: She is not in acute distress.    Appearance: Normal appearance. She is not ill-appearing, toxic-appearing or diaphoretic.  HENT:     Head: Normocephalic and atraumatic.  Pulmonary:     Effort: Pulmonary effort is normal.  Neurological:     Mental Status: She is alert and oriented to person, place, and time.  Psychiatric:        Mood and Affect: Mood normal.        Behavior: Behavior normal.        Thought Content: Thought content normal.        Judgment: Judgment normal.    Results for orders placed or performed during the hospital encounter of 10/31/20 (from the past 24 hour(s))  Urinalysis, Routine w reflex microscopic Urine, Clean Catch     Status: Abnormal   Collection Time: 10/31/20 11:28 AM  Result Value Ref Range   Color, Urine AMBER (A) YELLOW   APPearance CLEAR CLEAR   Specific Gravity, Urine 1.019 1.005 - 1.030   pH 7.0 5.0 - 8.0   Glucose, UA NEGATIVE NEGATIVE mg/dL   Hgb urine dipstick SMALL (A) NEGATIVE   Bilirubin Urine NEGATIVE NEGATIVE   Ketones, ur NEGATIVE NEGATIVE mg/dL   Protein, ur NEGATIVE NEGATIVE mg/dL   Nitrite NEGATIVE NEGATIVE   Leukocytes,Ua NEGATIVE NEGATIVE   RBC / HPF 0-5 0 - 5 RBC/hpf   WBC, UA 0-5 0 - 5 WBC/hpf   Bacteria, UA RARE (A) NONE SEEN   Squamous Epithelial / LPF 0-5 0 - 5   Mucus PRESENT   CBC     Status: Abnormal   Collection Time: 10/31/20 12:17 PM  Result Value Ref Range   WBC 4.8 4.0 - 10.5 K/uL   RBC 3.48 (L) 3.87 - 5.11 MIL/uL    Hemoglobin 12.3 12.0 - 15.0 g/dL   HCT 11/02/20 36 - 46 %   MCV 108.0 (H) 80.0 - 100.0 fL   MCH 35.3 (H) 26.0 - 34.0 pg   MCHC 32.7 30.0 - 36.0 g/dL   RDW 11/02/20 63.8 - 46.6 %   Platelets 173 150 - 400 K/uL  nRBC 0.0 0.0 - 0.2 %  ABO/Rh     Status: None   Collection Time: 10/31/20 12:17 PM  Result Value Ref Range   ABO/RH(D)      O POS Performed at [facility], [address] [place], [place] [address]   Wet prep, genital     Status: Abnormal   Collection Time: 10/31/20 12:21 PM   Specimen: Cervix  Result Value Ref Range   Yeast Wet Prep HPF POC NONE SEEN NONE SEEN   Trich, Wet Prep NONE SEEN NONE SEEN   Clue Cells Wet Prep HPF POC NONE SEEN NONE SEEN   WBC, Wet Prep HPF POC MANY (A) NONE SEEN   Sperm NONE SEEN    US OB LESS THAN 14 WEEKS WITH OB TRANSVAGINAL  Result Date: 10/31/2020 CLINICAL DATA:  Vaginal bleeding and cramping EXAM: OBSTETRIC <14 WK [place] AND TRANSVAGINAL OB US TECHNIQUE: Both transabdominal and transvaginal ultrasound examinations were performed for complete evaluation of the gestation as well as the maternal uterus, adnexal regions, and pelvic cul-de-sac. Transvaginal technique was performed to assess early pregnancy. COMPARISON:  None. FINDINGS: Intrauterine gestational sac: Present Yolk sac:  Present Embryo:  Present Cardiac Activity: Present Heart Rate: 166 bpm CRL:  14.3 mm mm   7 w   5 d                  [place] EDC: 06/14/2021 Subchorionic hemorrhage:  Small subchorionic hemorrhage is noted. Maternal uterus/adnexae: Right ovary is within normal limits. The left ovary is not well visualized. IMPRESSION: Single live intrauterine gestation at 7 weeks 5 days. Small subchorionic hemorrhage is noted. Electronically Signed   By: Alcide Clever M.D.   On: 10/31/2020 13:03    MAU Course  Procedures  MDM -IUP confirmed on [place] yesterday at [facility] -UA: amber/sm hgb/rare bacteria -CBC: normal H/H/platelets -[place]: single IUP, +embryo/yolk sac, [redacted]w[redacted]d, +FHR 166, sm SCH -hCG:  pending at time of discharge -ABO: O Positive -WetPrep: WNL -GC/CT collected -pt discharged to home in stable condition  Orders Placed This Encounter  Procedures  . Wet prep, genital    Standing Status:   Standing    Number of Occurrences:   1  . US OB LESS THAN 14 WEEKS WITH OB TRANSVAGINAL    Standing Status:   Standing    Number of Occurrences:   1    Order Specific Question:   Symptom/Reason for Exam    Answer:   Vaginal bleeding in pregnancy [[id number]]  . Urinalysis, Routine w reflex microscopic    Standing Status:   Standing    Number of Occurrences:   1  . CBC    Standing Status:   Standing    Number of Occurrences:   1  . hCG, quantitative, pregnancy    Standing Status:   Standing    Number of Occurrences:   1  . ABO/Rh    Standing Status:   Standing    Number of Occurrences:   1  . Discharge patient    Order Specific Question:   Discharge disposition    Answer:   01-Home or Self Care [1]    Order Specific Question:   Discharge patient date    Answer:   10/31/2020   No orders of the defined types were placed in this encounter.   Assessment and Plan   1. Subchorionic hemorrhage of placenta in first trimester, single or unspecified fetus   2. Vaginal bleeding in pregnancy  3. Blood type, Rh positive     Allergies as of 10/31/2020      Reactions   Amoxicillin Nausea Only   Amoxicillin Nausea And Vomiting   Compazine [prochlorperazine Edisylate]    Red man syndrome   Compazine [prochlorperazine] Other (See Comments)   Makes me crazy      Medication List    TAKE these medications   Abilify 5 MG tablet Generic drug: ARIPiprazole Take 5 mg by mouth. Take one half tab daily   ALPRAZolam 1 MG tablet Commonly known as: XANAX Take 1 mg by mouth daily.   amitriptyline 10 MG tablet Commonly known as: ELAVIL Take one tablet at night for one week, then take 2 tablets at night for one week, then take 3 tablets at night.   azaTHIOprine 50 MG tablet Commonly  known as: IMURAN 50 mg. Patient takes 125 mg daily   cholecalciferol 25 MCG (1000 UNIT) tablet Commonly known as: VITAMIN D3 Take 1,000 Units by mouth daily.   hydroxychloroquine 200 MG tablet Commonly known as: PLAQUENIL Take 400 mg by mouth daily.   isometheptene-acetaminophen-dichloralphenazone 65-100-325 MG capsule Commonly known as: MIDRIN Take 1 capsule by mouth 4 (four) times daily as needed.   Lomedia 24 FE 1-20 MG-MCG(24) tablet Generic drug: Norethindrone Acetate-Ethinyl Estrad-FE Take 1 tablet by mouth daily.   multivitamin-prenatal 27-0.8 MG Tabs tablet Take 1 tablet by mouth daily at 12 noon.   ondansetron 4 MG disintegrating tablet Commonly known as: ZOFRAN-ODT Take 4 mg by mouth every 8 (eight) hours as needed for nausea or vomiting.   oxyCODONE-acetaminophen 5-325 MG tablet Commonly known as: PERCOCET/ROXICET Take 1-2 tablets by mouth every 4 (four) hours as needed for severe pain.   oxyCODONE-acetaminophen 5-325 MG tablet Commonly known as: PERCOCET/ROXICET Take 1 tablet by mouth every 4 (four) hours as needed for severe pain.   pantoprazole 40 MG tablet Commonly known as: PROTONIX Take 40 mg by mouth 2 (two) times daily.   predniSONE 5 MG Tabs tablet Commonly known as: STERAPRED UNI-PAK Take 5 mg by mouth daily. TAPER by 5mg  each day till finished for sinus infection What changed: Another medication with the same name was changed. Make sure you understand how and when to take each.   predniSONE 10 MG tablet Commonly known as: DELTASONE Begin taking 6 tablets daily, taper by one tablet every other day until off the medication. What changed:   how much to take  additional instructions   Probiotic Daily Caps Take 1 tablet by mouth daily. Once or twice a day   sulfaSALAzine 500 MG tablet Commonly known as: AZULFIDINE Take 1,000 mg by mouth 2 (two) times daily.   SUMAtriptan 100 MG tablet Commonly known as: IMITREX Take 1 tablet (100 mg total)  by mouth 2 (two) times daily as needed for migraine or headache. May repeat in 2 hours if headache persists or recurs.       -will call with culture results, if positive -discussed [facility] -return MAU precautions given -continue care as scheduled -pt discharged to home in stable condition  Joni Reiningicole E Jovoni Borkenhagen 10/31/2020, 1:42 PM

## 2020-11-02 LAB — ABO/RH: ABO/RH(D): O POS

## 2020-11-02 LAB — GC/CHLAMYDIA PROBE AMP (~~LOC~~) NOT AT ARMC
Chlamydia: NEGATIVE
Comment: NEGATIVE
Comment: NORMAL
Neisseria Gonorrhea: NEGATIVE

## 2020-11-14 DIAGNOSIS — M329 Systemic lupus erythematosus, unspecified: Secondary | ICD-10-CM | POA: Diagnosis not present

## 2020-12-03 DIAGNOSIS — M0880 Other juvenile arthritis, unspecified site: Secondary | ICD-10-CM | POA: Diagnosis not present

## 2020-12-03 DIAGNOSIS — R768 Other specified abnormal immunological findings in serum: Secondary | ICD-10-CM | POA: Diagnosis not present

## 2020-12-03 DIAGNOSIS — N856 Intrauterine synechiae: Secondary | ICD-10-CM | POA: Diagnosis not present

## 2020-12-03 DIAGNOSIS — O0991 Supervision of high risk pregnancy, unspecified, first trimester: Secondary | ICD-10-CM | POA: Diagnosis not present

## 2020-12-03 DIAGNOSIS — Q909 Down syndrome, unspecified: Secondary | ICD-10-CM | POA: Diagnosis not present

## 2020-12-03 DIAGNOSIS — Z9221 Personal history of antineoplastic chemotherapy: Secondary | ICD-10-CM | POA: Diagnosis not present

## 2020-12-03 DIAGNOSIS — Z3A12 12 weeks gestation of pregnancy: Secondary | ICD-10-CM | POA: Diagnosis not present

## 2020-12-03 DIAGNOSIS — M329 Systemic lupus erythematosus, unspecified: Secondary | ICD-10-CM | POA: Diagnosis not present

## 2020-12-03 DIAGNOSIS — Z8742 Personal history of other diseases of the female genital tract: Secondary | ICD-10-CM | POA: Diagnosis not present

## 2020-12-03 DIAGNOSIS — O99891 Other specified diseases and conditions complicating pregnancy: Secondary | ICD-10-CM | POA: Diagnosis not present

## 2020-12-31 DIAGNOSIS — O99112 Other diseases of the blood and blood-forming organs and certain disorders involving the immune mechanism complicating pregnancy, second trimester: Secondary | ICD-10-CM | POA: Diagnosis not present

## 2020-12-31 DIAGNOSIS — M329 Systemic lupus erythematosus, unspecified: Secondary | ICD-10-CM | POA: Diagnosis not present

## 2020-12-31 DIAGNOSIS — Z3A16 16 weeks gestation of pregnancy: Secondary | ICD-10-CM | POA: Diagnosis not present

## 2020-12-31 DIAGNOSIS — R768 Other specified abnormal immunological findings in serum: Secondary | ICD-10-CM | POA: Diagnosis not present

## 2020-12-31 DIAGNOSIS — O209 Hemorrhage in early pregnancy, unspecified: Secondary | ICD-10-CM | POA: Diagnosis not present

## 2020-12-31 DIAGNOSIS — O99119 Other diseases of the blood and blood-forming organs and certain disorders involving the immune mechanism complicating pregnancy, unspecified trimester: Secondary | ICD-10-CM | POA: Diagnosis not present

## 2020-12-31 DIAGNOSIS — O0992 Supervision of high risk pregnancy, unspecified, second trimester: Secondary | ICD-10-CM | POA: Diagnosis not present

## 2020-12-31 DIAGNOSIS — Z3A12 12 weeks gestation of pregnancy: Secondary | ICD-10-CM | POA: Diagnosis not present

## 2020-12-31 DIAGNOSIS — Q909 Down syndrome, unspecified: Secondary | ICD-10-CM | POA: Diagnosis not present

## 2020-12-31 DIAGNOSIS — M0880 Other juvenile arthritis, unspecified site: Secondary | ICD-10-CM | POA: Diagnosis not present

## 2020-12-31 DIAGNOSIS — D6862 Lupus anticoagulant syndrome: Secondary | ICD-10-CM | POA: Diagnosis not present

## 2020-12-31 DIAGNOSIS — O99891 Other specified diseases and conditions complicating pregnancy: Secondary | ICD-10-CM | POA: Diagnosis not present

## 2020-12-31 DIAGNOSIS — Z8742 Personal history of other diseases of the female genital tract: Secondary | ICD-10-CM | POA: Diagnosis not present

## 2021-01-09 DIAGNOSIS — Z20822 Contact with and (suspected) exposure to covid-19: Secondary | ICD-10-CM | POA: Diagnosis not present

## 2021-01-14 DIAGNOSIS — O219 Vomiting of pregnancy, unspecified: Secondary | ICD-10-CM | POA: Diagnosis not present

## 2021-01-14 DIAGNOSIS — M329 Systemic lupus erythematosus, unspecified: Secondary | ICD-10-CM | POA: Diagnosis not present

## 2021-01-14 DIAGNOSIS — R768 Other specified abnormal immunological findings in serum: Secondary | ICD-10-CM | POA: Diagnosis not present

## 2021-01-15 DIAGNOSIS — O99112 Other diseases of the blood and blood-forming organs and certain disorders involving the immune mechanism complicating pregnancy, second trimester: Secondary | ICD-10-CM | POA: Diagnosis not present

## 2021-01-15 DIAGNOSIS — M329 Systemic lupus erythematosus, unspecified: Secondary | ICD-10-CM | POA: Diagnosis not present

## 2021-01-15 DIAGNOSIS — O26892 Other specified pregnancy related conditions, second trimester: Secondary | ICD-10-CM | POA: Diagnosis not present

## 2021-01-15 DIAGNOSIS — O358XX1 Maternal care for other (suspected) fetal abnormality and damage, fetus 1: Secondary | ICD-10-CM | POA: Diagnosis not present

## 2021-01-15 DIAGNOSIS — Z3A18 18 weeks gestation of pregnancy: Secondary | ICD-10-CM | POA: Diagnosis not present

## 2021-01-16 DIAGNOSIS — M329 Systemic lupus erythematosus, unspecified: Secondary | ICD-10-CM | POA: Diagnosis not present

## 2021-01-16 DIAGNOSIS — O4402 Placenta previa specified as without hemorrhage, second trimester: Secondary | ICD-10-CM | POA: Diagnosis not present

## 2021-01-16 DIAGNOSIS — Z3689 Encounter for other specified antenatal screening: Secondary | ICD-10-CM | POA: Diagnosis not present

## 2021-01-16 DIAGNOSIS — Z3A18 18 weeks gestation of pregnancy: Secondary | ICD-10-CM | POA: Diagnosis not present

## 2021-01-16 DIAGNOSIS — Q909 Down syndrome, unspecified: Secondary | ICD-10-CM | POA: Diagnosis not present

## 2021-01-16 DIAGNOSIS — O99891 Other specified diseases and conditions complicating pregnancy: Secondary | ICD-10-CM | POA: Diagnosis not present

## 2021-01-16 DIAGNOSIS — R768 Other specified abnormal immunological findings in serum: Secondary | ICD-10-CM | POA: Diagnosis not present

## 2021-01-21 DIAGNOSIS — Z3A19 19 weeks gestation of pregnancy: Secondary | ICD-10-CM | POA: Diagnosis not present

## 2021-01-21 DIAGNOSIS — O219 Vomiting of pregnancy, unspecified: Secondary | ICD-10-CM | POA: Diagnosis not present

## 2021-01-21 DIAGNOSIS — M329 Systemic lupus erythematosus, unspecified: Secondary | ICD-10-CM | POA: Diagnosis not present

## 2021-01-24 DIAGNOSIS — D2239 Melanocytic nevi of other parts of face: Secondary | ICD-10-CM | POA: Diagnosis not present

## 2021-01-24 DIAGNOSIS — D485 Neoplasm of uncertain behavior of skin: Secondary | ICD-10-CM | POA: Diagnosis not present

## 2021-01-28 DIAGNOSIS — R768 Other specified abnormal immunological findings in serum: Secondary | ICD-10-CM | POA: Diagnosis not present

## 2021-01-28 DIAGNOSIS — O0992 Supervision of high risk pregnancy, unspecified, second trimester: Secondary | ICD-10-CM | POA: Diagnosis not present

## 2021-01-28 DIAGNOSIS — Z3A20 20 weeks gestation of pregnancy: Secondary | ICD-10-CM | POA: Diagnosis not present

## 2021-01-28 DIAGNOSIS — M329 Systemic lupus erythematosus, unspecified: Secondary | ICD-10-CM | POA: Diagnosis not present

## 2021-01-28 DIAGNOSIS — O280 Abnormal hematological finding on antenatal screening of mother: Secondary | ICD-10-CM | POA: Diagnosis not present

## 2021-01-28 DIAGNOSIS — O358XX0 Maternal care for other (suspected) fetal abnormality and damage, not applicable or unspecified: Secondary | ICD-10-CM | POA: Diagnosis not present

## 2021-01-28 DIAGNOSIS — O99119 Other diseases of the blood and blood-forming organs and certain disorders involving the immune mechanism complicating pregnancy, unspecified trimester: Secondary | ICD-10-CM | POA: Diagnosis not present

## 2021-01-28 DIAGNOSIS — D6862 Lupus anticoagulant syndrome: Secondary | ICD-10-CM | POA: Diagnosis not present

## 2021-01-28 DIAGNOSIS — O4402 Placenta previa specified as without hemorrhage, second trimester: Secondary | ICD-10-CM | POA: Diagnosis not present

## 2021-02-11 DIAGNOSIS — D6862 Lupus anticoagulant syndrome: Secondary | ICD-10-CM | POA: Diagnosis not present

## 2021-02-11 DIAGNOSIS — O99119 Other diseases of the blood and blood-forming organs and certain disorders involving the immune mechanism complicating pregnancy, unspecified trimester: Secondary | ICD-10-CM | POA: Diagnosis not present

## 2021-02-11 DIAGNOSIS — O280 Abnormal hematological finding on antenatal screening of mother: Secondary | ICD-10-CM | POA: Diagnosis not present

## 2021-02-11 DIAGNOSIS — R768 Other specified abnormal immunological findings in serum: Secondary | ICD-10-CM | POA: Diagnosis not present

## 2021-02-25 DIAGNOSIS — R768 Other specified abnormal immunological findings in serum: Secondary | ICD-10-CM | POA: Diagnosis not present

## 2021-02-25 DIAGNOSIS — O99119 Other diseases of the blood and blood-forming organs and certain disorders involving the immune mechanism complicating pregnancy, unspecified trimester: Secondary | ICD-10-CM | POA: Diagnosis not present

## 2021-02-25 DIAGNOSIS — D6862 Lupus anticoagulant syndrome: Secondary | ICD-10-CM | POA: Diagnosis not present

## 2021-02-25 DIAGNOSIS — O99891 Other specified diseases and conditions complicating pregnancy: Secondary | ICD-10-CM | POA: Diagnosis not present

## 2021-02-27 DIAGNOSIS — O280 Abnormal hematological finding on antenatal screening of mother: Secondary | ICD-10-CM | POA: Diagnosis not present

## 2021-02-27 DIAGNOSIS — O4402 Placenta previa specified as without hemorrhage, second trimester: Secondary | ICD-10-CM | POA: Diagnosis not present

## 2021-02-27 DIAGNOSIS — R768 Other specified abnormal immunological findings in serum: Secondary | ICD-10-CM | POA: Diagnosis not present

## 2021-02-27 DIAGNOSIS — Z3A24 24 weeks gestation of pregnancy: Secondary | ICD-10-CM | POA: Diagnosis not present

## 2021-02-27 DIAGNOSIS — Z7982 Long term (current) use of aspirin: Secondary | ICD-10-CM | POA: Diagnosis not present

## 2021-02-27 DIAGNOSIS — O99891 Other specified diseases and conditions complicating pregnancy: Secondary | ICD-10-CM | POA: Diagnosis not present

## 2021-02-27 DIAGNOSIS — M0880 Other juvenile arthritis, unspecified site: Secondary | ICD-10-CM | POA: Diagnosis not present

## 2021-02-27 DIAGNOSIS — Z79899 Other long term (current) drug therapy: Secondary | ICD-10-CM | POA: Diagnosis not present

## 2021-02-27 DIAGNOSIS — M329 Systemic lupus erythematosus, unspecified: Secondary | ICD-10-CM | POA: Diagnosis not present

## 2021-02-27 DIAGNOSIS — Z9889 Other specified postprocedural states: Secondary | ICD-10-CM | POA: Diagnosis not present

## 2021-02-27 DIAGNOSIS — Z7952 Long term (current) use of systemic steroids: Secondary | ICD-10-CM | POA: Diagnosis not present

## 2021-02-27 DIAGNOSIS — O0992 Supervision of high risk pregnancy, unspecified, second trimester: Secondary | ICD-10-CM | POA: Diagnosis not present

## 2021-03-08 DIAGNOSIS — O99119 Other diseases of the blood and blood-forming organs and certain disorders involving the immune mechanism complicating pregnancy, unspecified trimester: Secondary | ICD-10-CM | POA: Diagnosis not present

## 2021-03-08 DIAGNOSIS — R768 Other specified abnormal immunological findings in serum: Secondary | ICD-10-CM | POA: Diagnosis not present

## 2021-03-08 DIAGNOSIS — D6862 Lupus anticoagulant syndrome: Secondary | ICD-10-CM | POA: Diagnosis not present

## 2021-03-08 DIAGNOSIS — O99891 Other specified diseases and conditions complicating pregnancy: Secondary | ICD-10-CM | POA: Diagnosis not present

## 2021-03-11 DIAGNOSIS — D2272 Melanocytic nevi of left lower limb, including hip: Secondary | ICD-10-CM | POA: Diagnosis not present

## 2021-03-11 DIAGNOSIS — L309 Dermatitis, unspecified: Secondary | ICD-10-CM | POA: Diagnosis not present

## 2021-03-11 DIAGNOSIS — B351 Tinea unguium: Secondary | ICD-10-CM | POA: Diagnosis not present

## 2021-03-11 DIAGNOSIS — D2262 Melanocytic nevi of left upper limb, including shoulder: Secondary | ICD-10-CM | POA: Diagnosis not present

## 2021-03-21 DIAGNOSIS — O360120 Maternal care for anti-D [Rh] antibodies, second trimester, not applicable or unspecified: Secondary | ICD-10-CM | POA: Diagnosis not present

## 2021-03-21 DIAGNOSIS — O358XX0 Maternal care for other (suspected) fetal abnormality and damage, not applicable or unspecified: Secondary | ICD-10-CM | POA: Diagnosis not present

## 2021-03-21 DIAGNOSIS — Q211 Atrial septal defect: Secondary | ICD-10-CM | POA: Diagnosis not present

## 2021-03-21 DIAGNOSIS — O360190 Maternal care for anti-D [Rh] antibodies, unspecified trimester, not applicable or unspecified: Secondary | ICD-10-CM | POA: Diagnosis not present

## 2021-03-21 DIAGNOSIS — M329 Systemic lupus erythematosus, unspecified: Secondary | ICD-10-CM | POA: Diagnosis not present

## 2021-03-21 DIAGNOSIS — Z3A27 27 weeks gestation of pregnancy: Secondary | ICD-10-CM | POA: Diagnosis not present

## 2021-03-21 DIAGNOSIS — M0820 Juvenile rheumatoid arthritis with systemic onset, unspecified site: Secondary | ICD-10-CM | POA: Diagnosis not present

## 2021-03-21 DIAGNOSIS — O99891 Other specified diseases and conditions complicating pregnancy: Secondary | ICD-10-CM | POA: Diagnosis not present

## 2021-03-21 DIAGNOSIS — Z79899 Other long term (current) drug therapy: Secondary | ICD-10-CM | POA: Diagnosis not present

## 2021-04-02 DIAGNOSIS — O26899 Other specified pregnancy related conditions, unspecified trimester: Secondary | ICD-10-CM | POA: Diagnosis not present

## 2021-04-02 DIAGNOSIS — Z139 Encounter for screening, unspecified: Secondary | ICD-10-CM | POA: Diagnosis not present

## 2021-04-02 DIAGNOSIS — M069 Rheumatoid arthritis, unspecified: Secondary | ICD-10-CM | POA: Diagnosis not present

## 2021-04-02 DIAGNOSIS — M797 Fibromyalgia: Secondary | ICD-10-CM | POA: Diagnosis not present

## 2021-04-02 DIAGNOSIS — Z3A29 29 weeks gestation of pregnancy: Secondary | ICD-10-CM | POA: Diagnosis not present

## 2021-04-02 DIAGNOSIS — M329 Systemic lupus erythematosus, unspecified: Secondary | ICD-10-CM | POA: Diagnosis not present

## 2021-04-02 DIAGNOSIS — O09293 Supervision of pregnancy with other poor reproductive or obstetric history, third trimester: Secondary | ICD-10-CM | POA: Diagnosis not present

## 2021-04-16 DIAGNOSIS — M329 Systemic lupus erythematosus, unspecified: Secondary | ICD-10-CM | POA: Diagnosis not present

## 2021-04-16 DIAGNOSIS — M3500 Sicca syndrome, unspecified: Secondary | ICD-10-CM | POA: Diagnosis not present

## 2021-04-23 DIAGNOSIS — M329 Systemic lupus erythematosus, unspecified: Secondary | ICD-10-CM | POA: Diagnosis not present

## 2021-04-23 DIAGNOSIS — M3500 Sicca syndrome, unspecified: Secondary | ICD-10-CM | POA: Diagnosis not present

## 2021-11-15 IMAGING — US US OB < 14 WEEKS - US OB TV
1 series · 15 of 28 positions shown · non-contrast
Comparison: None.

CLINICAL DATA: Vaginal bleeding and cramping

EXAM:
OBSTETRIC <14 WK US AND TRANSVAGINAL OB US
TECHNIQUE: Both transabdominal and transvaginal ultrasound examinations were
performed for complete evaluation of the gestation as well as the
maternal uterus, adnexal regions, and pelvic cul-de-sac.
Transvaginal technique was performed to assess early pregnancy.

[Series 1: us ob < 14 weeks - us ob tv · 15 of 46 slices shown]
[im 1/46]
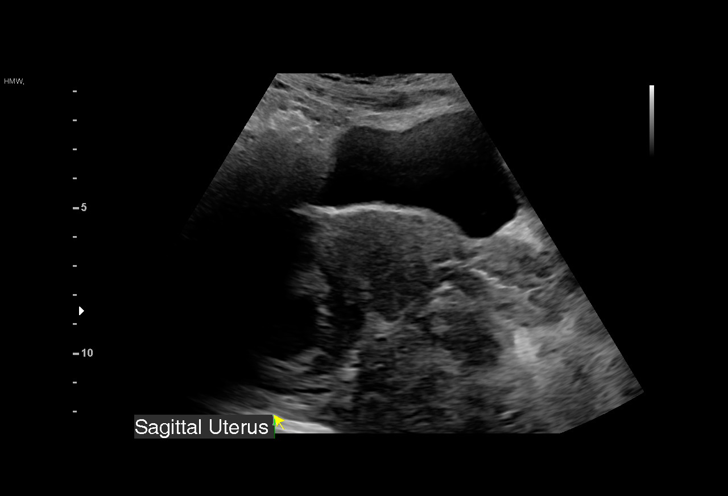
[im 4/46]
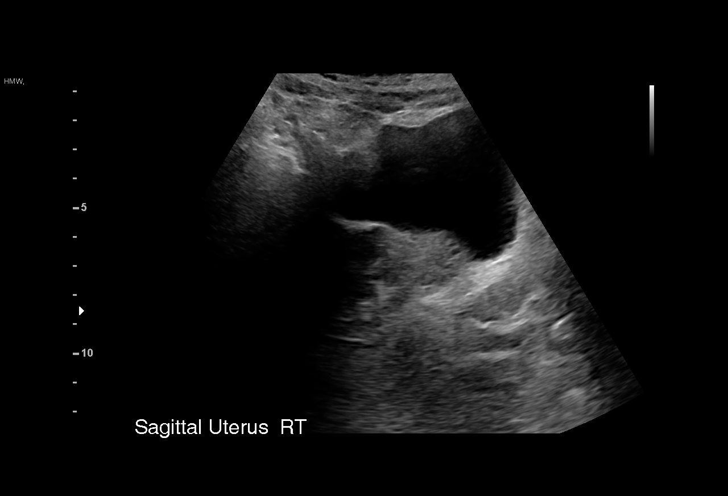
[im 7/46]
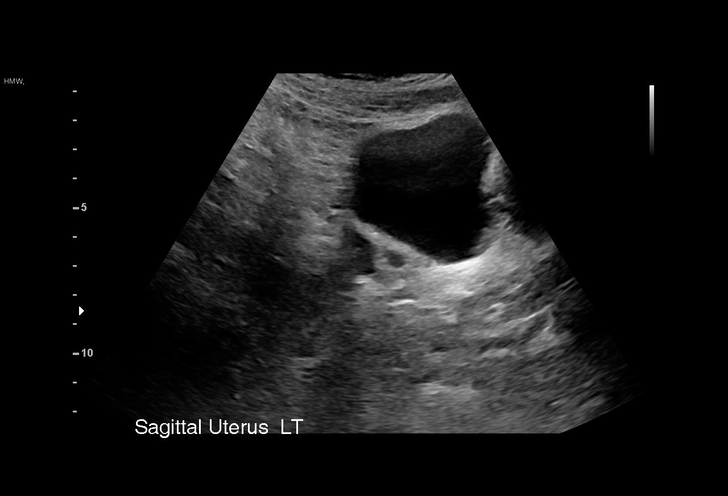
[im 11/46]
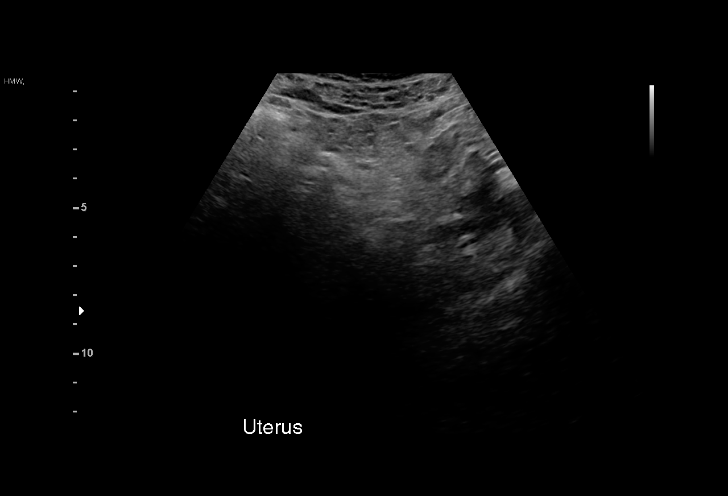
[im 14/46]
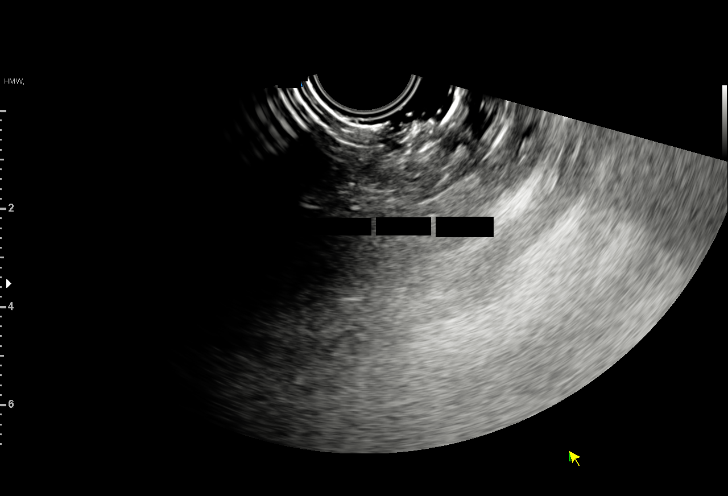
[im 17/46]
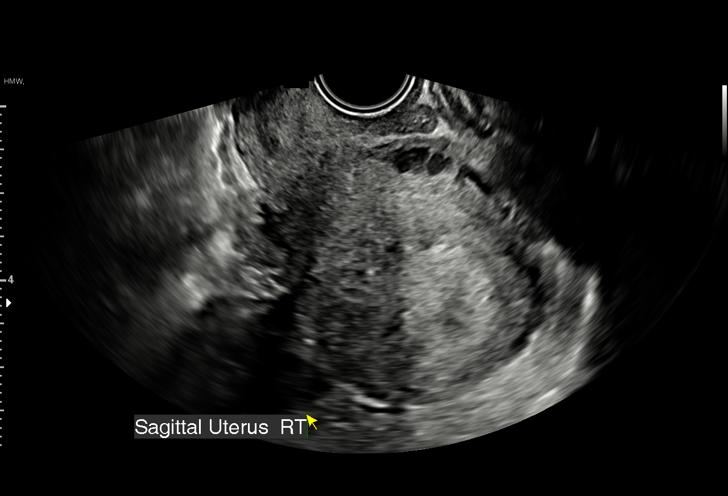
[im 21/46]
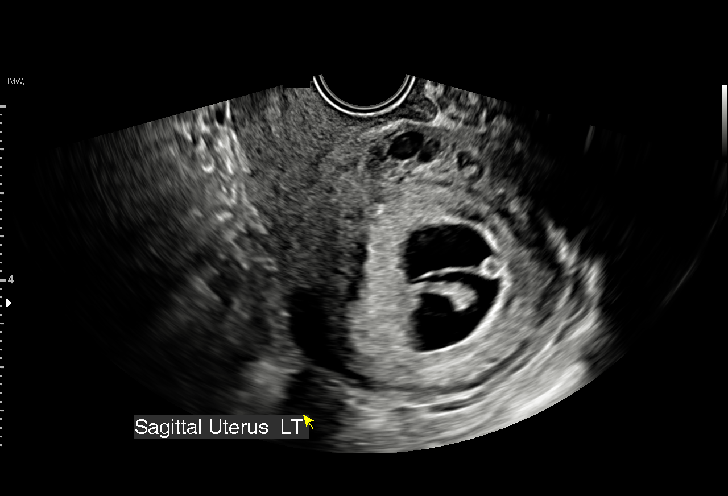
[im 24/46]
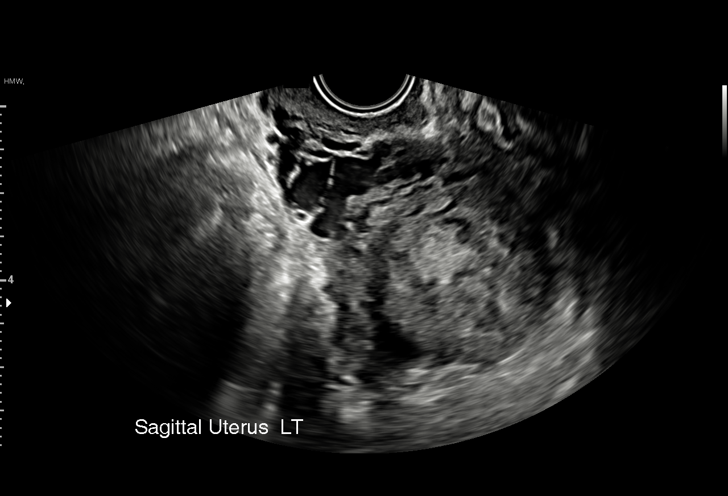
[im 26/46]
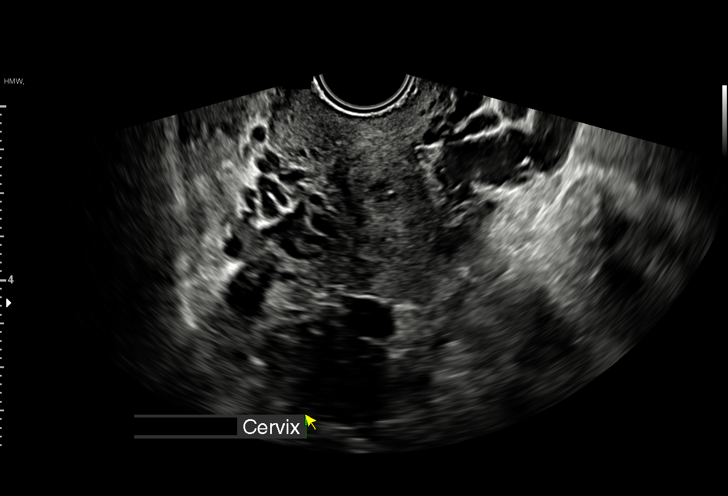
[im 29/46]
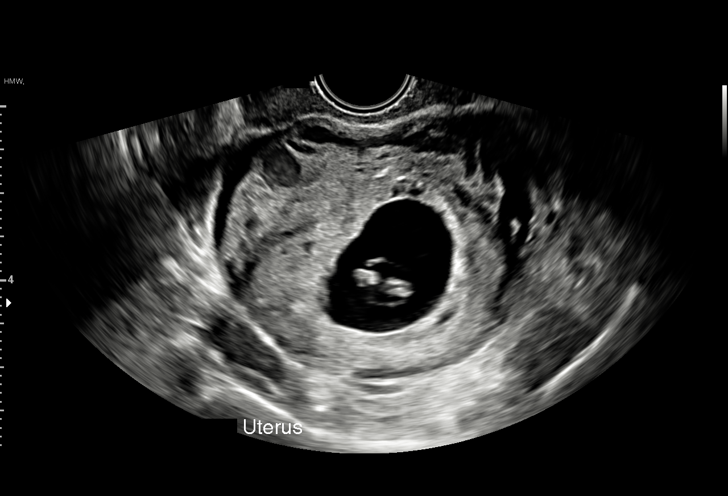
[im 32/46]
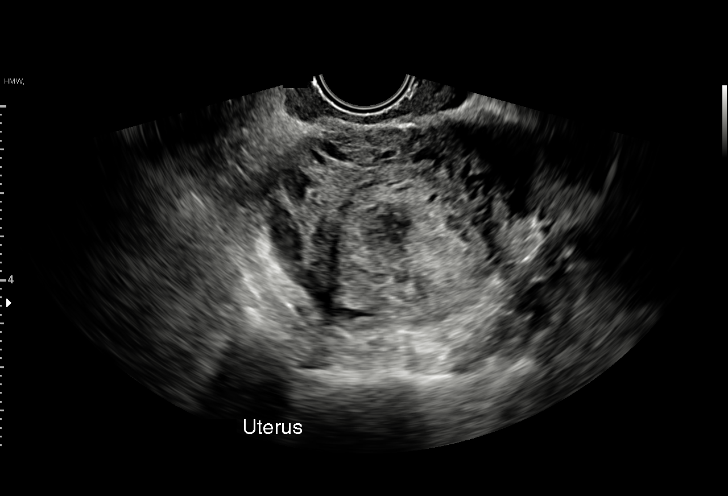
[im 36/46]
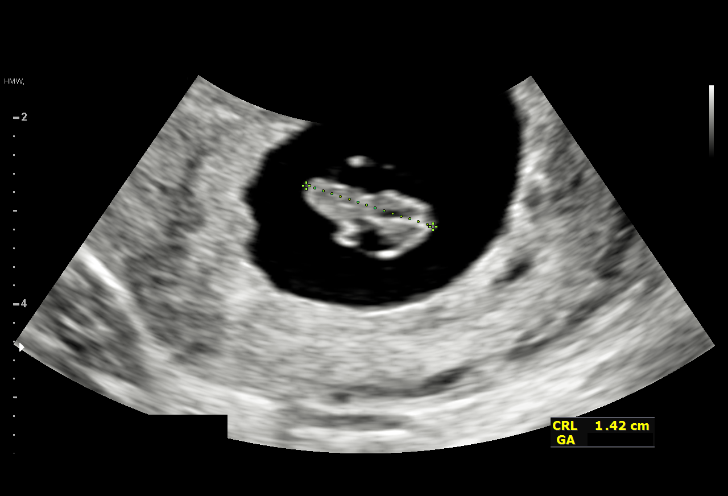
[im 39/46]
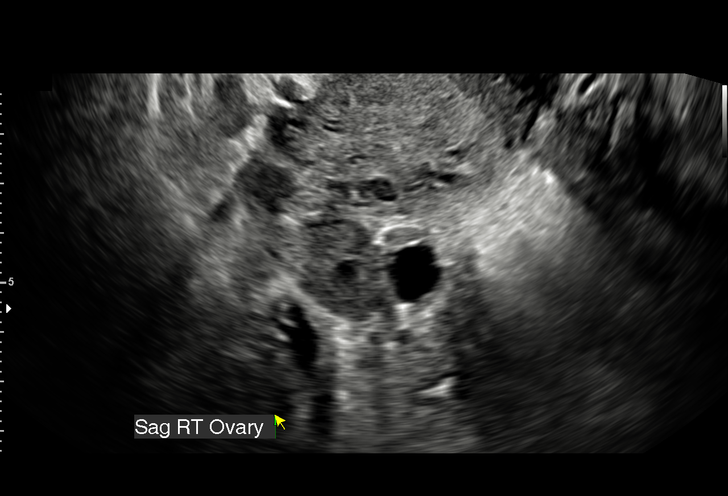
[im 42/46]
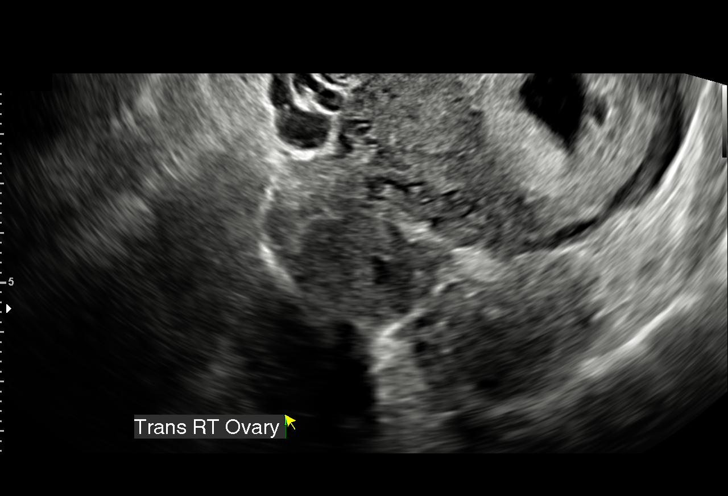
[im 46/46]
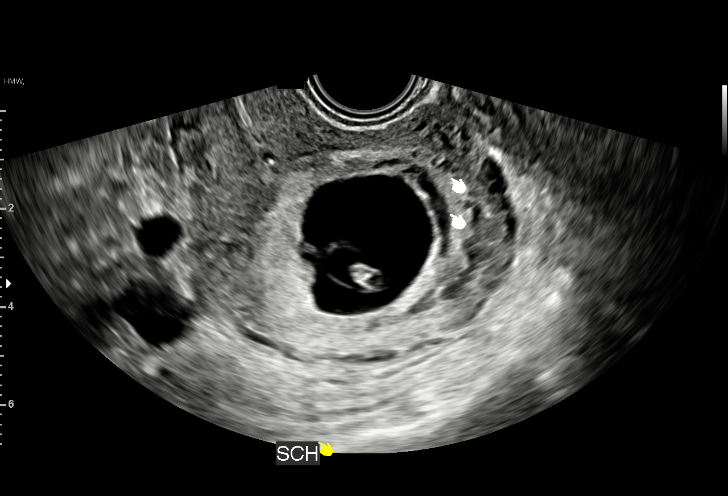

[15 of 28 positions shown; findings below may reference images not displayed]

FINDINGS: Intrauterine gestational sac: Present

Yolk sac:  Present

Embryo:  Present

Cardiac Activity: Present

Heart Rate: 166 bpm

CRL:  14.3 mm mm   7 w   5 d                  US EDC: 06/14/2021

Subchorionic hemorrhage:  Small subchorionic hemorrhage is noted.

Maternal uterus/adnexae: Right ovary is within normal limits. The
left ovary is not well visualized.
IMPRESSION: Single live intrauterine gestation at 7 weeks 5 days.

Small subchorionic hemorrhage is noted.
